# Patient Record
Sex: Female | Born: 1960 | Race: White | Hispanic: No | Marital: Married | Smoking: Former smoker
Health system: Southern US, Community
[De-identification: ages and names within clinical notes are randomized; demographics above are authoritative.]

## PROBLEM LIST (undated history)

## (undated) DIAGNOSIS — R001 Bradycardia, unspecified: Secondary | ICD-10-CM

## (undated) DIAGNOSIS — M1990 Unspecified osteoarthritis, unspecified site: Secondary | ICD-10-CM

## (undated) HISTORY — PX: INGUINAL HERNIA REPAIR: SHX194

## (undated) HISTORY — PX: HIP SURGERY: SHX245

## (undated) HISTORY — PX: HERNIA REPAIR: SHX51

## (undated) HISTORY — PX: TOTAL VAGINAL HYSTERECTOMY: SHX2548

## (undated) HISTORY — PX: ABDOMINAL HYSTERECTOMY: SHX81

## (undated) HISTORY — PX: GALLBLADDER SURGERY: SHX652

## (undated) HISTORY — DX: Unspecified osteoarthritis, unspecified site: M19.90

## (undated) HISTORY — PX: CHOLECYSTECTOMY: SHX55

---

## 2011-02-28 ENCOUNTER — Ambulatory Visit (INDEPENDENT_AMBULATORY_CARE_PROVIDER_SITE_OTHER): Payer: Federal, State, Local not specified - PPO | Admitting: Family Medicine

## 2011-02-28 ENCOUNTER — Encounter: Payer: Self-pay | Admitting: Family Medicine

## 2011-02-28 VITALS — BP 114/71 | HR 48 | Temp 97.0°F | Resp 16 | Ht 66.0 in | Wt 177.6 lb

## 2011-02-28 DIAGNOSIS — M25559 Pain in unspecified hip: Secondary | ICD-10-CM

## 2011-02-28 DIAGNOSIS — E669 Obesity, unspecified: Secondary | ICD-10-CM

## 2011-02-28 DIAGNOSIS — Z23 Encounter for immunization: Secondary | ICD-10-CM

## 2011-02-28 DIAGNOSIS — Z0000 Encounter for general adult medical examination without abnormal findings: Secondary | ICD-10-CM

## 2011-02-28 DIAGNOSIS — G8929 Other chronic pain: Secondary | ICD-10-CM

## 2011-02-28 DIAGNOSIS — Z720 Tobacco use: Secondary | ICD-10-CM

## 2011-02-28 LAB — CBC
MCH: 31.5 pg (ref 26.0–34.0)
MCV: 94.8 fL (ref 78.0–100.0)
Platelets: 190 10*3/uL (ref 150–400)
RDW: 13.5 % (ref 11.5–15.5)
WBC: 7 10*3/uL (ref 4.0–10.5)

## 2011-02-28 LAB — POCT URINALYSIS DIPSTICK
Nitrite, UA: NEGATIVE
Urobilinogen, UA: 0.2
pH, UA: 5.5

## 2011-02-28 LAB — COMPREHENSIVE METABOLIC PANEL
ALT: 10 U/L (ref 0–35)
AST: 16 U/L (ref 0–37)
Chloride: 107 mEq/L (ref 96–112)
Creat: 0.73 mg/dL (ref 0.50–1.10)
Total Bilirubin: 0.5 mg/dL (ref 0.3–1.2)

## 2011-02-28 LAB — POCT UA - MICROSCOPIC ONLY
Casts, Ur, LPF, POC: NEGATIVE
Mucus, UA: NEGATIVE
Yeast, UA: NEGATIVE

## 2011-02-28 LAB — LIPID PANEL
Total CHOL/HDL Ratio: 3.5 Ratio
VLDL: 20 mg/dL (ref 0–40)

## 2011-02-28 NOTE — Patient Instructions (Signed)
Smoking Cessation, Tips for Success YOU CAN QUIT SMOKING If you are ready to quit smoking, congratulations! You have chosen to help yourself be healthier. Cigarettes bring nicotine, tar, carbon monoxide, and other irritants into your body. Your lungs, heart, and blood vessels will be able to work better without these poisons. There are many different ways to quit smoking. Nicotine gum, nicotine patches, a nicotine inhaler, or nicotine nasal spray can help with physical craving. Hypnosis, support groups, and medicines help break the habit of smoking. Here are some tips to help you quit for good.  Throw away all cigarettes.     Clean and remove all ashtrays from your home, work, and car.     On a card, write down your reasons for quitting. Carry the card with you and read it when you get the urge to smoke.     Cleanse your body of nicotine. Drink enough water and fluids to keep your urine clear or pale yellow. Do this after quitting to flush the nicotine from your body.     Learn to predict your moods. Do not let a bad situation be your excuse to have a cigarette. Some situations in your life might tempt you into wanting a cigarette.     Never have "just one" cigarette. It leads to wanting another and another. Remind yourself of your decision to quit.     Change habits associated with smoking. If you smoked while driving or when feeling stressed, try other activities to replace smoking. Stand up when drinking your coffee. Brush your teeth after eating. Sit in a different chair when you read the paper. Avoid alcohol while trying to quit, and try to drink fewer caffeinated beverages. Alcohol and caffeine may urge you to smoke.     Avoid foods and drinks that can trigger a desire to smoke, such as sugary or spicy foods and alcohol.     Ask people who smoke not to smoke around you.     Have something planned to do right after eating or having a cup of coffee. Take a walk or exercise to perk you up.  This will help to keep you from overeating.     Try a relaxation exercise to calm you down and decrease your stress. Remember, you may be tense and nervous for the first 2 weeks after you quit, but this will pass.     Find new activities to keep your hands busy. Play with a pen, coin, or rubber band. Doodle or draw things on paper.     Brush your teeth right after eating. This will help cut down on the craving for the taste of tobacco after meals. You can try mouthwash, too.     Use oral substitutes, such as lemon drops, carrots, a cinnamon stick, or chewing gum, in place of cigarettes. Keep them handy so they are available when you have the urge to smoke.     When you have the urge to smoke, try deep breathing.     Designate your home as a nonsmoking area.     If you are a heavy smoker, ask your caregiver about a prescription for nicotine chewing gum. It can ease your withdrawal from nicotine.     Reward yourself. Set aside the cigarette money you save and buy yourself something nice.     Look for support from others. Join a support group or smoking cessation program. Ask someone at home or at work to help you with your plan  to quit smoking.     Always ask yourself, "Do I need this cigarette or is this just a reflex?" Tell yourself, "Today, I choose not to smoke," or "I do not want to smoke." You are reminding yourself of your decision to quit, even if you do smoke a cigarette.  HOW WILL I FEEL WHEN I QUIT SMOKING?  The benefits of not smoking start within days of quitting.     You may have symptoms of withdrawal because your body is used to nicotine (the addictive substance in cigarettes). You may crave cigarettes, be irritable, feel very hungry, cough often, get headaches, or have difficulty concentrating.     The withdrawal symptoms are only temporary. They are strongest when you first quit but will go away within 10 to 14 days.     When withdrawal symptoms occur, stay in control.  Think about your reasons for quitting. Remind yourself that these are signs that your body is healing and getting used to being without cigarettes.     Remember that withdrawal symptoms are easier to treat than the major diseases that smoking can cause.     Even after the withdrawal is over, expect periodic urges to smoke. However, these cravings are generally short-lived and will go away whether you smoke or not. Do not smoke!     If you relapse and smoke again, do not lose hope. Most smokers quit 3 times before they are successful.     If you relapse, do not give up! Plan ahead and think about what you will do the next time you get the urge to smoke.  LIFE AS A NONSMOKER: MAKE IT FOR A MONTH, MAKE IT FOR LIFE Day 1: Hang this page where you will see it every day. Day 2: Get rid of all ashtrays, matches, and lighters. Day 3: Drink water. Breathe deeply between sips. Day 4: Avoid places with smoke-filled air, such as bars, clubs, or the smoking section of restaurants. Day 5: Keep track of how much money you save by not smoking. Day 6: Avoid boredom. Keep a good book with you or go to the movies. Day 7: Reward yourself! One week without smoking! Day 8: Make a dental appointment to get your teeth cleaned. Day 9: Decide how you will turn down a cigarette before it is offered to you. Day 10: Review your reasons for quitting. Day 11: Distract yourself. Stay active to keep your mind off smoking and to relieve tension. Take a walk, exercise, read a book, do a crossword puzzle, or try a new hobby. Day 12: Exercise. Get off the bus before your stop or use stairs instead of escalators. Day 13: Call on friends for support and encouragement. Day 14: Reward yourself! Two weeks without smoking! Day 15: Practice deep breathing exercises. Day 16: Bet a friend that you can stay a nonsmoker. Day 17: Ask to sit in nonsmoking sections of restaurants. Day 18: Hang up "No Smoking" signs. Day 19: Think of  yourself as a nonsmoker. Day 20: Each morning, tell yourself you will not smoke. Day 21: Reward yourself! Three weeks without smoking! Day 22: Think of smoking in negative ways. Remember how it stains your teeth, gives you bad breath, and leaves you short of breath. Day 23: Eat a nutritious breakfast. Day 24:Do not relive your days as a smoker. Day 25: Hold a pencil in your hand when talking on the telephone. Day 26: Tell all your friends you do not smoke. Day 27: Think  about how much better food tastes. Day 28: Remember, one cigarette is one too many. Day 29: Take up a hobby that will keep your hands busy. Day 30: Congratulations! One month without smoking! Give yourself a big reward. Your caregiver can direct you to community resources or hospitals for support, which may include:  Group support.     Education.    Hypnosis.    Subliminal therapy.  Document Released: 10/12/2003 Document Revised: 09/25/2010 Document Reviewed: 10/30/2008 [facility] Patient Information 2012 [place], [place]   .Obesity Obesity is defined as having a body mass index (BMI) of 30 or more. To calculate your BMI divide your weight in pounds by your height in inches squared and multiply that product by 703. Major illnesses resulting from long-term obesity include:  Stroke.     Heart disease.     Diabetes.    Many cancers.     Arthritis.  Obesity also complicates recovery from many other medical problems.   CAUSES    A history of obesity in your parents.     Thyroid hormone imbalance.     Environmental factors such as excess calorie intake and physical inactivity.  TREATMENT   A healthy weight loss program includes:  A calorie restricted diet based on individual calorie needs.     Increased physical activity (exercise).  An exercise program is just as important as the right low-calorie diet.   Weight-loss medicines should be used only under the supervision of your physician. These medicines  help, but only if they are used with diet and exercise programs. Medicines can have side effects including nervousness, nausea, abdominal pain, diarrhea, headache, drowsiness, and depression.   An unhealthy weight loss program includes:  Fasting.     Fad diets.     Supplements and drugs.  These choices do not succeed in long-term weight control.   HOME CARE INSTRUCTIONS   To help you make the needed dietary changes:    Exercise and perform physical activity as directed by your caregiver.     Keep a daily record of everything you eat. There are many free websites to help you with this. It may be helpful to measure your foods so you can determine if you are eating the correct portion sizes.     Use low-calorie cookbooks or take special cooking classes.     Avoid alcohol. Drink more water and drinks with no calories.     Take vitamins and supplements only as recommended by your caregiver.     Weight loss support groups, Registered Dieticians, counselors, and stress reduction education can also be very helpful.  Document Released: 02/21/2004 Document Revised: 09/25/2010 Document Reviewed: 12/20/2006 [facility] Patient Information 2012 [place], [place].

## 2011-03-03 ENCOUNTER — Encounter: Payer: Self-pay | Admitting: Family Medicine

## 2011-03-03 NOTE — Progress Notes (Signed)
  Subjective:    Patient ID: Nicole Wilcox, female    DOB: September 06, 1960, 51 y.o.   MRN: [id number]  HPI    This 51 y.o. Cauc female presents for complete physical exam; her last PAP was 2011(she is s/p TAH-BSO for benign reasons). Last mmg was 2011 and was normal. These HCM exams were performed in [place]. She is retired due to chronic hip pain which is disabling.    Review of Systems  Constitutional: Negative.  Negative for fever, activity change and appetite change.  HENT: Negative.   Eyes: Positive for visual disturbance.       Wears "readers"  Respiratory: Negative.   Cardiovascular: Negative.   Gastrointestinal: Negative.   Genitourinary: Negative for dysuria, urgency, frequency, difficulty urinating and pelvic pain.  Musculoskeletal: Positive for arthralgias and gait problem.       Chronic hip pain limits mobility and interrupts sleep. Weakness in muscles and joints.  Skin: Negative.   Neurological: Negative.   Psychiatric/Behavioral: Negative.        Objective:   Physical Exam  Vitals reviewed. Constitutional: She is oriented to person, place, and time. She appears well-developed and well-nourished.  HENT:  Head: Normocephalic and atraumatic.  Right Ear: External ear normal.  Left Ear: External ear normal.  Nose: Nose normal.  Mouth/Throat: Oropharynx is clear and moist.  Eyes: Conjunctivae and EOM are normal. Pupils are equal, round, and reactive to light. No scleral icterus.  Neck: Normal range of motion. Neck supple. No JVD present. No thyromegaly present.  Cardiovascular: Normal rate, normal heart sounds and intact distal pulses.   No murmur heard. Pulmonary/Chest: Effort normal and breath sounds normal. No respiratory distress. She has no wheezes.  Abdominal: Soft. Bowel sounds are normal. She exhibits no mass. There is no tenderness. There is no guarding.  Genitourinary: Guaiac negative stool.       Normal external genitalia; no hemorrhoids, no internal rectal  masses.  Musculoskeletal: She exhibits tenderness. She exhibits no edema.       Decreased ROM of left hip; ext rotation limited to 30 degrees; SLR to 45 degrees passively (less with voluntary SLR). Weak hip flexor muscle.  Lymphadenopathy:    She has no cervical adenopathy.  Neurological: She is alert and oriented to person, place, and time. She has normal reflexes. No cranial nerve deficit. She exhibits normal muscle tone.  Skin: Skin is warm and dry.  Psychiatric: She has a normal mood and affect. Her behavior is normal. Thought content normal.          Assessment & Plan:   1. Routine general medical examination at a health care facility    2. Tobacco user  Pt given print info re: Smoking cessation and advised to contact [place] for assistance/support. Also may consider E-cigarettes.  3. Chronic left hip pain  Stable; encouraged regular exercise for maintenance of flexibility. OTC analgesic effective for now.  4. Obesity  Print info re: simple weight loss tips (i.e. Portion control and elimination of junk food and eating regular meals and healthy snacks.)  5. Health care maintenance  Mammogram to be scheduled.  6. Hematuria  POCT UA - Microscopic Only ;Urinalysis showed no significant RBCs (0-1)

## 2011-03-03 NOTE — Progress Notes (Signed)
Quick Note:  You labs are abnormal. Please call pt and notify her that her cholesterol is elevated ( 210). Her HDL is very good= 60 ; LDL = 130. Advise her to address dietary changes as outlined in the handout given to her at her visit. Consider rechecking these values in 3-6 months.  All other labs are normal; Vit D level is pending.  ______

## 2011-03-04 LAB — VITAMIN D 1,25 DIHYDROXY
Vitamin D 1, 25 (OH)2 Total: 35 pg/mL (ref 18–72)
Vitamin D3 1, 25 (OH)2: 35 pg/mL

## 2011-05-01 ENCOUNTER — Telehealth: Payer: Self-pay

## 2011-05-01 NOTE — Telephone Encounter (Signed)
Pt states that she would like to have something called in for her to stop smoking.  Pt states she would like for someone to give her a call when you send it in to the pharmacy or if she needs to come in and see you again.

## 2011-05-05 ENCOUNTER — Telehealth: Payer: Self-pay

## 2011-05-05 NOTE — Telephone Encounter (Signed)
Pt states she called Thursday to request rx to stop smoking no one has returned her called

## 2011-05-05 NOTE — Telephone Encounter (Signed)
Dr. McPherson - please review. 

## 2011-05-06 MED ORDER — VARENICLINE TARTRATE 0.5 MG PO TABS: ORAL_TABLET | ORAL | Status: DC

## 2011-05-06 MED ORDER — VARENICLINE TARTRATE 1 MG PO TABS: ORAL_TABLET | ORAL | Status: DC

## 2011-05-06 NOTE — Telephone Encounter (Signed)
I will e-prescribe Chantix and she will need to follow the instructions for best results. She will need to stop smoking within 10-14 days of starting the product. Contact the clinic if she has problems with Chantix.

## 2011-05-06 NOTE — Telephone Encounter (Signed)
Addended by: Dow Adolph B on: 05/06/2011 06:56 PM   Modules accepted: Orders

## 2011-05-06 NOTE — Telephone Encounter (Signed)
I have reviewed pt's chart and request for aid for smoking cessation. Chantix has been e-prescribed and pt is encouraged to make sure she understands use of this product. She can contact clinic with any questions.

## 2011-05-07 NOTE — Telephone Encounter (Signed)
Gave pt info about chantix Rx. Pt was worried about SEs of depression she has heard about, but reports that she does not have any Sxs of depression now. Pt agreed to try Chantix and CB if she has any SEs of depression or anything else.

## 2011-07-03 ENCOUNTER — Encounter: Payer: Self-pay | Admitting: Family Medicine

## 2011-07-03 ENCOUNTER — Ambulatory Visit (INDEPENDENT_AMBULATORY_CARE_PROVIDER_SITE_OTHER): Payer: Federal, State, Local not specified - PPO | Admitting: Family Medicine

## 2011-07-03 VITALS — BP 114/76 | HR 60 | Temp 98.1°F | Resp 18 | Ht 65.5 in | Wt 181.8 lb

## 2011-07-03 DIAGNOSIS — Z720 Tobacco use: Secondary | ICD-10-CM

## 2011-07-03 DIAGNOSIS — E8940 Asymptomatic postprocedural ovarian failure: Secondary | ICD-10-CM

## 2011-07-03 DIAGNOSIS — R6889 Other general symptoms and signs: Secondary | ICD-10-CM

## 2011-07-03 DIAGNOSIS — Z789 Other specified health status: Secondary | ICD-10-CM

## 2011-07-03 LAB — CBC WITH DIFFERENTIAL/PLATELET
Basophils Relative: 1 % (ref 0–1)
Eosinophils Absolute: 0.2 10*3/uL (ref 0.0–0.7)
Lymphs Abs: 1.9 10*3/uL (ref 0.7–4.0)
MCH: 30.9 pg (ref 26.0–34.0)
MCHC: 33.1 g/dL (ref 30.0–36.0)
Neutro Abs: 2.9 10*3/uL (ref 1.7–7.7)
Neutrophils Relative %: 54 % (ref 43–77)
Platelets: 198 10*3/uL (ref 150–400)
RBC: 4.37 MIL/uL (ref 3.87–5.11)

## 2011-07-03 LAB — TSH: TSH: 1.477 u[IU]/mL (ref 0.350–4.500)

## 2011-07-03 LAB — T4, FREE: Free T4: 1.08 ng/dL (ref 0.80–1.80)

## 2011-07-03 NOTE — Patient Instructions (Signed)
Menopause and Herbal Products Menopause is the normal time of life when menstrual periods stop completely. Menopause is complete when you have missed 12 consecutive menstrual periods. It usually occurs between the ages of 51 to 55, with an average age of 51. Very rarely does a woman develop menopause before 51 years old. At menopause, your ovaries stop producing the female hormones, estrogen and progesterone. This can cause undesirable symptoms and also affect your health. Sometimes the symptoms can occur 4 to 5 years before the menopause begins. There is no relationship between menopause and:  Oral contraceptives.   Number of children you had.   Race.   The age your menstrual periods started (menarche).  Heavy smokers and very thin women may develop menopause earlier in life. Estrogen and progesterone hormone treatment is the usual method of treating menopausal symptoms. However, there are women who should not take hormone treatment. This is true of:   Women that have breast or uterine cancer.   Women who prefer not to take hormones because of certain side effects (abnormal uterine bleeding).   Women who are afraid that hormones may cause breast cancer.   Women who have a history of liver disease, heart disease, stroke, or blood clots.  For these women, there are other medications that may help treat their menopausal symptoms. These medications are found in plants and botanical products. They can be found in the form of herbs, teas, oils, tinctures, and pills.  CAUSES:  The ovaries stop producing the female hormones estrogen and progesterone.   Other causes include:   Surgery to remove both ovaries.   The ovaries stop functioning for no know reason.   Tumors of the pituitary gland in the brain.   Medical disease that affects the ovaries and hormone production.   Radiation treatment to the abdomen or pelvis.   Chemotherapy that affects the ovaries.   PHYTOESTROGENS: Phytoestrogen's occur naturally in plants and plant products. They act like estrogen in the body. Herbal medications are made from these plants and botanical steroids. There are 3 types of phytoestrogens:  Isoflavones (genistein and daidzein) are found in soy, garbanzo beans, miso and tofu foods.   Ligins are found in the shell of seeds. They are used to make oils like flaxseed oil. The bacteria in your intestine act on these foods to produce the estrogen-like hormones.   Coumestans are estrogen-like. Some of the foods they are found in include sunflower seeds and bean sprouts.  CONDITIONS AND THERE POSSIBLE HERBAL TREATMENT:  Hot flashes and night sweats.   Soy, black cohosh and evening primrose.   Irritability, insomnia, depression and memory problems.   Chasteberry, ginseng, and soy.   St. John's wort may be helpful for depression. However, there is a concern of it causing cataracts of the eye and may have bad effects on other medications. St. John's wort should not be taken for long time and without your caregiver's advice.   Loss of libido and vaginal and skin dryness.   Wild yam and soy.   Prevention of coronary heart disease and osteoporosis.   Soy and Isoflavones.  Several studies have shown that some women benefit from herbal medications, but most of the studies have not consistently shown that these supplements are much better than placebo. Other forms of treatment to help women with menopausal symptoms include a balanced diet, rest, exercise, vitamin and calcium (with vitamin D) supplements, acupuncture, and group therapy when necessary. THOSE WHO SHOULD NOT TAKE HERBAL MEDICATIONS INCLUDE:    Women who are planning on getting pregnant unless told by your caregiver.   Women who are breastfeeding unless told by your caregiver.   Women who are taking other prescription medications unless told by your caregiver.   Infants, children, and elderly women unless  told by your caregiver.  Different herbal medications have different and unmeasured amounts of the herbal ingredients. There are no regulations, quality control, and standardization of the ingredients in herbal medications. Therefore, the amount of the ingredient in the medication may vary from one herb, pill, tea, oil or tincture to another. Many herbal medications can cause serious problems and can even have poisonous effects if taken too much or too long. If problems develop, the medication should be stopped and recorded by your caregiver. HOME CARE INSTRUCTIONS  Do not take or give children herbal medications without your caregiver's advice.   Let your caregiver know all the medications you are taking. This includes prescription, over-the-counter, eye drops, and creams.   Do not take herbal medications longer or more than recommended.   Tell your caregiver about any side effects from the medication.  SEEK MEDICAL CARE IF:  You develop a fever of 102 F (38.9 C), or as directed by your caregiver.   You feel sick to your stomach (nauseous), vomit, or have diarrhea.   You develop a rash.   You develop abdominal pain.   You develop severe headaches.   You start to have vision problems.   You feel dizzy or faint.   You start to feel numbness in any part of your body.   You start shaking (have convulsions).  Document Released: 07/02/2007 Document Revised: 01/02/2011 Document Reviewed: 01/29/2010 [facility] Patient Information 2012 [place], [place].   Get OTC Multivitamin for Women over age 48 [employer] Made for women is a good brand) and take 1 tablet daily with a large meal. Also start Vitamin D 1000 IU  Take 1 capsule daily and try to get sun exposure most days of the week.

## 2011-07-04 LAB — VITAMIN D 25 HYDROXY (VIT D DEFICIENCY, FRACTURES): Vit D, 25-Hydroxy: 27 ng/mL — ABNORMAL LOW (ref 30–89)

## 2011-07-06 ENCOUNTER — Encounter: Payer: Self-pay | Admitting: Family Medicine

## 2011-07-06 DIAGNOSIS — E8940 Asymptomatic postprocedural ovarian failure: Secondary | ICD-10-CM | POA: Insufficient documentation

## 2011-07-06 NOTE — Progress Notes (Signed)
  Subjective:    Patient ID: Nicole Wilcox, female    DOB: 24-Nov-1960, 51 y.o.   MRN: [id number]  HPI   This 51 y.o. Cauc female c/o cold intolerance, palpitations without any other cardiac symptoms  and anxiety for several weeks. She recently quit smoking using Chantix but had to discontinue the this  medication after 5 weeks because of side effects. She is s/p TAH-BSO years ago and ha never been  on HRT. She does have mild night sweats. She feels comfortable sitting on her porch or working in the  yard in warm weather and does not perspire.    Review of Systems  Constitutional: Negative for fever, chills, activity change, appetite change, fatigue and unexpected weight change.  HENT: Negative for sore throat, trouble swallowing and neck pain.   Eyes: Negative.   Respiratory: Negative for cough, chest tightness and shortness of breath.   Cardiovascular: Positive for palpitations. Negative for chest pain.  Gastrointestinal: Negative.   Skin: Negative.   Neurological: Negative.   Psychiatric/Behavioral: Negative.        Objective:   Physical Exam  Vitals reviewed. Constitutional: She is oriented to person, place, and time. She appears well-developed and well-nourished. No distress.  HENT:  Head: Normocephalic and atraumatic.  Right Ear: External ear normal.  Left Ear: External ear normal.  Mouth/Throat: Oropharynx is clear and moist.  Eyes: Conjunctivae and EOM are normal. Pupils are equal, round, and reactive to light. No scleral icterus.  Neck: Normal range of motion. Neck supple. No thyromegaly present.  Cardiovascular: Normal rate, regular rhythm and normal heart sounds.  Exam reveals no gallop and no friction rub.   No murmur heard. Pulmonary/Chest: Effort normal and breath sounds normal. She has no wheezes.  Abdominal: Soft. Bowel sounds are normal. There is no tenderness. There is no guarding.  Lymphadenopathy:    She has no cervical adenopathy.  Neurological: She is  alert and oriented to person, place, and time. She displays abnormal reflex. No cranial nerve deficit.       DTRs diminished (0-1+)  Skin: Skin is warm and dry. No rash noted. No erythema. No pallor.  Psychiatric: She has a normal mood and affect. Her behavior is normal.          Assessment & Plan:   1. Cold intolerance - R/O Thyroid disorder T4, Free, TSH, CBC with Differential, Vitamin D, 25-hydroxy  2. Surgical menopause    3. Medication intolerance  Early discontinuance of Chantix due to side effects; pt remains tobacco-free

## 2011-07-08 NOTE — Progress Notes (Signed)
Quick Note:  Please call pt and advise that the following labs are abnormal... Vitamin D is a little low. Get Vitamin D3 2000 IU and take 1 capsule daily . Try to get daily sun exposure for 10-15 minutes and eat more fish (salmon,tuna, sardines) . Also have more Vitamin D fortified foods.  All other labs are normal.  Copy to pt. ______

## 2011-07-09 ENCOUNTER — Telehealth: Payer: Self-pay

## 2011-07-09 NOTE — Telephone Encounter (Signed)
Patient wants to know her lab results

## 2011-07-10 NOTE — Telephone Encounter (Signed)
Explained lab results to pt. She wanted to know if low vit D could cause her to be cold all the time? Also she stated that at one time she was told that she had falcemia and wondered if that could be coming back. She thought Dr Audria Nine tested for that. Was that lab done and was it negative?

## 2011-07-11 NOTE — Telephone Encounter (Signed)
Getting Vit D up to ~ 50 should help her feel better. Patients present with a variety of symptoms when they are tested for Vit D Deficiency. She should feel better after taking the Vit D for several months.  Re: possible history of Thalassemia (if this is what she is referring to)- her CBC shows not abnormalities  at all which is not typical for this type of anemia. With some Thalassemias, individuals ar completely asymptomatic.  There is usually some abnormality of the CBC that would cause the lab to do a smear and other tests to explain the abnormal results. Again, pt's CBC was absolutely normal.

## 2011-07-12 NOTE — Telephone Encounter (Signed)
Advised pt of notes 

## 2011-09-26 ENCOUNTER — Ambulatory Visit (INDEPENDENT_AMBULATORY_CARE_PROVIDER_SITE_OTHER): Payer: Federal, State, Local not specified - PPO | Admitting: Family Medicine

## 2011-09-26 ENCOUNTER — Encounter: Payer: Self-pay | Admitting: Family Medicine

## 2011-09-26 VITALS — BP 124/80 | HR 51 | Temp 97.4°F | Resp 16 | Ht 65.0 in | Wt 190.6 lb

## 2011-09-26 DIAGNOSIS — F329 Major depressive disorder, single episode, unspecified: Secondary | ICD-10-CM

## 2011-09-26 DIAGNOSIS — F32A Depression, unspecified: Secondary | ICD-10-CM

## 2011-09-26 DIAGNOSIS — Z87891 Personal history of nicotine dependence: Secondary | ICD-10-CM

## 2011-09-26 MED ORDER — BUPROPION HCL ER (SR) 150 MG PO TB12: 150.0000 mg | ORAL_TABLET | Freq: Two times a day (BID) | ORAL | Status: DC

## 2011-09-26 NOTE — Patient Instructions (Signed)
Medication for Depression and nicotine addiction: For the first week, take Bupropion once a day in the morning. Starting the 2nd week, take Bupropion twice a day (dose it 8 hours apart). Get into an exercise program like water aerobics which will allow you to exercise without putting a lot of strain on your joints.

## 2011-09-26 NOTE — Progress Notes (Signed)
S: This 51 y.o. Cauc female has recently quit smoking by using Chantix. Now she is experiencing depression because of weight gain and she really misses cigarettes. Her husband smokes and she does not like the smell of tobacco yet she feels  the urge to resume cigarette use. She cannot exercise as much as she would like due to chronic hip pain; she is awaiting appt for MRI and possible surgery. She has tried walking with her granddaughter which is the one reason she gives for  smoking cessation.  ROS: Unintentional weight gain but no change of appetite; + chronic hip pain; + dysphoric mood and irritability. No cough, chest tightness, SOB, wheezing, GI upset.  O:  Filed Vitals:   09/26/11 0824  BP: 124/80  Pulse: 51  Temp: 97.4 F (36.3 C)  Resp: 16  Weight up  13 lbs since Feb 2013  GEN: In NAD; WN,WD. HENT: [place]/AT; EOMI, conj/scl clear. LUNGS: Normal resp rate and effort; no cough or wheezes COR: RRR NEURO: A&O x 3; CNs intact; otherwise nonfocal. Mentation: Affect is somewhat flat but mood is normal, pt is attentive and calm with good eye contact and engaged in conversation.   A/P: 1. Depression - RX: Buproprion SR 150 mg 1 tab every AM x 1 week then 1 tablet  Twice a day (not less than 8 hours apart). Try to join an aquatics exercise class and attend at least 2-3 days /week. Follow-up with ORTHO re: hip pain and treatment plan.  2. History of tobacco use - encouraged continued abstinence    Total face-to-face time with pt= 30 minutes.

## 2011-12-19 ENCOUNTER — Encounter: Payer: Self-pay | Admitting: Family Medicine

## 2011-12-19 ENCOUNTER — Ambulatory Visit (INDEPENDENT_AMBULATORY_CARE_PROVIDER_SITE_OTHER): Payer: Federal, State, Local not specified - PPO | Admitting: Family Medicine

## 2011-12-19 VITALS — BP 100/70 | HR 52 | Temp 98.7°F | Resp 16 | Ht 65.5 in | Wt 195.2 lb

## 2011-12-19 DIAGNOSIS — Z86010 Personal history of colonic polyps: Secondary | ICD-10-CM

## 2011-12-19 DIAGNOSIS — R1013 Epigastric pain: Secondary | ICD-10-CM

## 2011-12-19 DIAGNOSIS — G8929 Other chronic pain: Secondary | ICD-10-CM

## 2011-12-19 DIAGNOSIS — R110 Nausea: Secondary | ICD-10-CM

## 2011-12-19 DIAGNOSIS — M25559 Pain in unspecified hip: Secondary | ICD-10-CM

## 2011-12-19 MED ORDER — OMEPRAZOLE 20 MG PO CPDR: 20.0000 mg | DELAYED_RELEASE_CAPSULE | Freq: Every day | ORAL | Status: DC

## 2011-12-19 MED ORDER — BUPROPION HCL ER (SR) 150 MG PO TB12: 150.0000 mg | ORAL_TABLET | Freq: Two times a day (BID) | ORAL | Status: DC

## 2011-12-19 MED ORDER — DICLOFENAC SODIUM 75 MG PO TBEC: 75.0000 mg | DELAYED_RELEASE_TABLET | Freq: Two times a day (BID) | ORAL | Status: DC

## 2011-12-19 NOTE — Patient Instructions (Signed)
Omeprazole has been prescribed to help with nausea and abdominal discomfort. Take 1 tablet every AM 30 minutes before eating. Take Diclofenac twice daily with food or snack; this med is for hip pain. Do not take any OTC medications (like Motrin or Aleve) while taking this medication.

## 2011-12-21 ENCOUNTER — Encounter: Payer: Self-pay | Admitting: Family Medicine

## 2011-12-21 NOTE — Progress Notes (Signed)
S:  This pt returns to discuss medications and inability to reduce weight. She monitors her nutrition but is unable to exercise due to persistent left hip pain. Her local orthopedist recommends conservative treatment; pt was sent to a general surgeon  at a [facility]. The Gen Surgeon has referred pt to a colleague of his in the Orthopedics Dept. because he feels that her pain is due to hip/pelvic bone pathology. She reports inabiltiy to bear weight on left leg for any period of time. She has had leg give way when she steps awkwardly . Pain is not controlled with current medications; pt is taking Ibuprofen 600 mg several times daily, often on empty stomach. She now c/o nausea and epig discomfort. She reports some dark stools.  ROS: As per HPI. Denies vomiting, BRBPR, melena, easy bruising, dizziness, weakness or syncope.  O:  Filed Vitals:   12/19/11 0940  BP: 100/70  Pulse: 52  Temp: 98.7 F (37.1 C)  Resp: 16   GEN: In NAD; WN,WD. SKIN: No pallor. HENT: [place]/AT; EOMI w/ clear conj/scl. Oroph benign w/ moist mucosa. NECK: No LAN or TMG. COR: RRR. LINGS: CTA. ABD: Soft, nondistended,; mild epig tenderness. No Murphy's sign. No masses or HSM. BACK: No CVAT, NEURO: A&O x 3; CNs intact. Nonfocal. Mentation- flat affect.  A/P: 1. Nausea alone  Discontinue Ibuprofen or other OTC NSAIDs RX: Omeprazole 20 mg 1 tab every AM.  2. Dyspepsia  PPI as above.  3. Chronic left hip pain  RX: Diclofenac 75 mg 1 tab bid w/ food or snack.  4. History of colon polyps  Ambulatory referral to Gastroenterology   Pt to remain on Bupropion as she remains tobacco-free.

## 2012-05-28 ENCOUNTER — Encounter: Payer: Self-pay | Admitting: Family Medicine

## 2012-05-28 ENCOUNTER — Ambulatory Visit (INDEPENDENT_AMBULATORY_CARE_PROVIDER_SITE_OTHER): Payer: Federal, State, Local not specified - PPO | Admitting: Family Medicine

## 2012-05-28 VITALS — BP 138/70 | HR 48 | Temp 98.0°F | Resp 16 | Ht 65.0 in | Wt 196.4 lb

## 2012-05-28 DIAGNOSIS — R6889 Other general symptoms and signs: Secondary | ICD-10-CM

## 2012-05-28 DIAGNOSIS — F32A Depression, unspecified: Secondary | ICD-10-CM

## 2012-05-28 DIAGNOSIS — R001 Bradycardia, unspecified: Secondary | ICD-10-CM

## 2012-05-28 DIAGNOSIS — F329 Major depressive disorder, single episode, unspecified: Secondary | ICD-10-CM

## 2012-05-28 DIAGNOSIS — I498 Other specified cardiac arrhythmias: Secondary | ICD-10-CM

## 2012-05-28 NOTE — Progress Notes (Signed)
S:  This 52 y.o. Cauc female is here for evaluation of low heart rate noted during colonoscopy earlier this week. Pt c/o cold intolerance, mild fatigue (though she attends aquatic aerobics), low libido and depressive symptoms. She reports intermittent palpitations after 30 minutes of exercise. Pt has been smoke- free for 1 year. She is most frustrated by weight gain and is feeling post-menopausal. She is trying a special weight loss program but has lost "only 5 pounds". She is concerned that her mother's 1st heart attack occurred at age 85 (pt's current age). She stopped taking Bupropion (ineffective); she has some situational stressors right now but feels like she is stable off medication. She sleeps well and has no thoughts of self-harm.   Patient Active Problem List   Diagnosis Date Noted  . 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 Tobacco user- pt quit early 2013 02/28/2011  . Chronic left hip pain 02/28/2011  . Obesity 02/28/2011  . Health care maintenance 02/28/2011    PMHx, Soc Hx and Fam Hx reviewed.  ROS: As per HPI. Negative for diaphoresis, activity change, difficulty swallowing, CP or tightness, SOB or DOE, cough, n/v or GI distress, HA or dizziness, weakness, numbness or syncope.   O:  Filed Vitals:   05/28/12 1631  BP: 138/70                         Weight is up 3 pounds since Nov 2103  Pulse: 48  Temp: 98 F (36.7 C)  Resp: 16   GEN: In NAD; WN,WD. HENT: [place]/AT; EOMI w/ clear conj/ sclerae. Oroph clear and moist. Otherwise unremarkable. COR: Sinus bradycardia. LUNGS: Normal resp rate and effort. SKIN: W&D; no rashes or pallor. NEURO: A&O x 3; CNs intact. Mentation- mildly sluggish w/ flat affect; calm demeanor, normal speech pattern, thought content and judgement.  ECG: Sinus bradycardia; no ST-TW changes and no ectopy.   A/P: Bradycardia - this is chronic and pt is symptomatic. ECG has no evidence of ischemia.  Plan: EKG 12-Lead, Basic metabolic panel, Ambulatory referral  to Cardiology  Cold intolerance - Plan: TSH, T3, Free, T4, Free, CBC with Differential  Chronic depression - pt feels like this is a situation depression; she is most bothered about her weight. She is not interested in anti-depressant medication at this time.   Plan: TSH  Pt advised to seek care in ED if she experiences CP or tightness, uncomfortable sustained palpitations, diaphoresis, dizziness, weakness or near-syncope.

## 2012-05-29 LAB — CBC WITH DIFFERENTIAL/PLATELET
Eosinophils Relative: 3 % (ref 0–5)
HCT: 39.5 % (ref 36.0–46.0)
Lymphocytes Relative: 37 % (ref 12–46)
Lymphs Abs: 2 10*3/uL (ref 0.7–4.0)
MCV: 88 fL (ref 78.0–100.0)
Monocytes Absolute: 0.3 10*3/uL (ref 0.1–1.0)
Monocytes Relative: 6 % (ref 3–12)
RBC: 4.49 MIL/uL (ref 3.87–5.11)
WBC: 5.5 10*3/uL (ref 4.0–10.5)

## 2012-05-29 LAB — BASIC METABOLIC PANEL
Chloride: 103 mEq/L (ref 96–112)
Potassium: 3.9 mEq/L (ref 3.5–5.3)
Sodium: 140 mEq/L (ref 135–145)

## 2012-05-29 LAB — T3, FREE: T3, Free: 2.6 pg/mL (ref 2.3–4.2)

## 2012-05-29 LAB — TSH: TSH: 2.008 u[IU]/mL (ref 0.350–4.500)

## 2012-05-31 ENCOUNTER — Telehealth: Payer: Self-pay

## 2012-05-31 NOTE — Telephone Encounter (Signed)
DR.MCPHERSON, PT STATES THAT SHE IS REALLY ACHEY HEART STILL IN THE 40'S, PT WOULD LIKE TO KNOW WHAT IS GOING ON WITH HER. BEST# [phone]

## 2012-05-31 NOTE — Progress Notes (Signed)
Quick Note:  Please notify pt that results are normal.   Provide pt with copy of labs. ______ 

## 2012-06-01 NOTE — Telephone Encounter (Signed)
I called this pt to discuss recent CARD eval and TFTs (normal). Pt still thinks she has a thyroid disorder (+ family hx). She is scheduled for Holter and stress test then f/u with Dr. Jacinto Halim. She admits excessive caffeine intake throughout the day; advised that she cut back over next 2 weeks to no more than 2 cups of coffee daily. If no cardiac etiology found, will refer pt to Endocrine specialist. She agrees w/ this plan.

## 2012-06-23 ENCOUNTER — Encounter: Payer: Self-pay | Admitting: Family Medicine

## 2012-06-24 ENCOUNTER — Encounter: Payer: Self-pay | Admitting: Family Medicine

## 2012-10-05 ENCOUNTER — Ambulatory Visit (INDEPENDENT_AMBULATORY_CARE_PROVIDER_SITE_OTHER): Payer: Federal, State, Local not specified - PPO | Admitting: Family Medicine

## 2012-10-05 ENCOUNTER — Encounter: Payer: Self-pay | Admitting: Family Medicine

## 2012-10-05 VITALS — BP 120/78 | HR 47 | Temp 98.1°F | Resp 16 | Ht 65.0 in | Wt 204.0 lb

## 2012-10-05 DIAGNOSIS — E669 Obesity, unspecified: Secondary | ICD-10-CM

## 2012-10-05 DIAGNOSIS — Z1159 Encounter for screening for other viral diseases: Secondary | ICD-10-CM

## 2012-10-05 DIAGNOSIS — Z0000 Encounter for general adult medical examination without abnormal findings: Secondary | ICD-10-CM

## 2012-10-05 LAB — POCT URINALYSIS DIPSTICK
Bilirubin, UA: NEGATIVE
Glucose, UA: NEGATIVE
Leukocytes, UA: NEGATIVE
Nitrite, UA: NEGATIVE
Urobilinogen, UA: 0.2
pH, UA: 7

## 2012-10-05 LAB — LIPID PANEL
Cholesterol: 208 mg/dL — ABNORMAL HIGH (ref 0–200)
Triglycerides: 84 mg/dL (ref <150)

## 2012-10-05 LAB — COMPREHENSIVE METABOLIC PANEL
ALT: 17 U/L (ref 0–35)
BUN: 17 mg/dL (ref 6–23)
CO2: 29 mEq/L (ref 19–32)
Calcium: 9.7 mg/dL (ref 8.4–10.5)
Chloride: 105 mEq/L (ref 96–112)
Creat: 0.83 mg/dL (ref 0.50–1.10)
Glucose, Bld: 85 mg/dL (ref 70–99)

## 2012-10-05 LAB — HEPATITIS C ANTIBODY: HCV Ab: NEGATIVE

## 2012-10-05 NOTE — Patient Instructions (Addendum)
Keeping You Healthy  Get These Tests  Blood Pressure- Have your blood pressure checked by your healthcare provider at least once a year.  Normal blood pressure is 120/80.  Weight- Have your body mass index (BMI) calculated to screen for obesity.  BMI is a measure of body fat based on height and weight.  You can calculate your own BMI at https://www.west-esparza.com/  Cholesterol- Have your cholesterol checked every year.  Diabetes- Have your blood sugar checked every year if you have high blood pressure, high cholesterol, a family history of diabetes or if you are overweight.  Pap Smear- Have a pap smear every 1 to 3 years if you have been sexually active.  If you are older than 65 and recent pap smears have been normal you may not need additional pap smears.  In addition, if you have had a hysterectomy  For benign disease additional pap smears are not necessary.  Mammogram-Yearly mammograms are essential for early detection of breast cancer  Screening for Colon Cancer- Colonoscopy starting at age 40. Screening may begin sooner depending on your family history and other health conditions.  Follow up colonoscopy as directed by your Gastroenterologist.  Screening for Osteoporosis- Screening begins at age 2 with bone density scanning, sooner if you are at higher risk for developing Osteoporosis.  Get these medicines  Calcium with Vitamin D- Your body requires 1200-1500 mg of Calcium a day and [phone] IU of Vitamin D a day.  You can only absorb 500 mg of Calcium at a time therefore Calcium must be taken in 2 or 3 separate doses throughout the day.  Hormones- Hormone therapy has been associated with increased risk for certain cancers and heart disease.  Talk to your healthcare provider about if you need relief from menopausal symptoms.  Aspirin- Ask your healthcare provider about taking Aspirin to prevent Heart Disease and Stroke.  Get these Immuniztions  Flu shot- Every fall  Pneumonia  shot- Once after the age of 52; if you are younger ask your healthcare provider if you need a pneumonia shot.  Tetanus- Every ten years. This is next due in 2023.  Zostavax- Once after the age of 65 to prevent shingles.  Take these steps  Don't smoke- Your healthcare provider can help you quit. For tips on how to quit, ask your healthcare provider or go to www.smokefree.gov or call 1-800 QUIT-NOW.  Be physically active- Exercise 5 days a week for a minimum of 30 minutes.  If you are not already physically active, start slow and gradually work up to 30 minutes of moderate physical activity.  Try walking, dancing, bike riding, swimming, etc. You are doing a great job at staying physically active; don't give up even if you have not seen the results that you desire.  Eat a healthy diet- Eat a variety of healthy foods such as fruits, vegetables, whole grains, low fat milk, low fat cheeses, yogurt, lean meats, chicken, fish, eggs, dried beans, tofu, etc.  For more information go to www.thenutritionsource.org  Dental visit- Brush and floss teeth twice daily; visit your dentist twice a year.  Eye exam- Visit your Optometrist or Ophthalmologist yearly.  Drink alcohol in moderation- Limit alcohol intake to one drink or less a day.  Never drink and drive.  Depression- Your emotional health is as important as your physical health.  If you're feeling down or losing interest in things you normally enjoy, please talk to your healthcare provider.  Seat Belts- can save your life; always wear  one  Smoke/Carbon Clinical cytogeneticist- These detectors need to be installed on the appropriate level of your home.  Replace batteries at least once a year.  Violence- If anyone is threatening or hurting you, please tell your healthcare provider.  Living Will/ Health care power of attorney- Discuss with your healthcare provider and family.   A referral for Nutrition Counseling has been placed. I will see you again in  6 weeks to discuss anxiety/ depression/ stress and how this could be affecting your weight loss efforts. DO NOT GIVE UP !!!

## 2012-10-05 NOTE — Progress Notes (Signed)
Subjective:    Patient ID: Nicole Wilcox, female    DOB: Jul 11, 1960, 52 y.o.   MRN: [id number]  HPI  This 52 y.o. Cauc female is here for CPE ,s/p BSO-TAH for benign reasons. Chronic L hip  pain to be evaluated by ORTHO in ~ 3 weeks in [place], [place]. Pt is exercising (swimming and  other aerobics) 4-5 days /week in addition to nutrition changes. She is very frustrated that weight  continues to increase despite these lifestyle changes, instituted 2-3 months ago. Weight gain  onset is linked to smoking cessation in April 2013.   Bradycardia was eval by Dr. Jacinto Halim and no further visits are needed. Pt had no ischemia on ETT;   she has average exercise tolerance. Dr. Jacinto Halim recommended activity as tolerated. She also had   nocturnal oxygen saturation test which indicated low probability of sleep apnea.   Pt's husband has PTSD and she has significant stress related to his care and their relationship.   HCM: MMG- Current (negative).            IMM- Current.            PAP- NA.            Vision- Advised evaluation.  Patient Active Problem List   Diagnosis Date Noted  . 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 Tobacco user 02/28/2011  . Chronic left hip pain 02/28/2011  . Obesity 02/28/2011  . Health care maintenance 02/28/2011    PMHx, Soc Hx and Fam Hx reviewed.  Medications reconciled.   Review of Systems  Constitutional: Positive for fatigue and unexpected weight change.  HENT: Negative.   Eyes: Negative.   Respiratory: Negative.   Cardiovascular: Negative.   Gastrointestinal: Negative.   Endocrine: Negative.   Genitourinary: Negative.   Musculoskeletal: Positive for arthralgias.  Skin: Negative.   Allergic/Immunologic: Negative.   Neurological: Negative.   Hematological: Negative.   Psychiatric/Behavioral: The patient is nervous/anxious.        Objective:   Physical Exam  Nursing note and vitals reviewed. Constitutional: She is oriented to person, place, and time. She appears  well-developed and well-nourished. No distress.  HENT:  Head: Normocephalic and atraumatic.  Right Ear: Hearing, external ear and ear canal normal. Tympanic membrane is scarred.  Left Ear: Hearing, external ear and ear canal normal. Tympanic membrane is scarred.  Nose: Nose normal. No mucosal edema, rhinorrhea, nasal deformity or septal deviation.  Mouth/Throat: Uvula is midline, oropharynx is clear and moist and mucous membranes are normal. No oral lesions. Normal dentition.  Eyes: Conjunctivae, EOM and lids are normal. Pupils are equal, round, and reactive to light. No scleral icterus.  Fundoscopic exam:      The right eye shows no arteriolar narrowing and no papilledema. The right eye shows red reflex.       The left eye shows no arteriolar narrowing and no papilledema. The left eye shows red reflex.  Neck: Trachea normal, normal range of motion, full passive range of motion without pain and phonation normal. Neck supple. No JVD present. No spinous process tenderness and no muscular tenderness present. Carotid bruit is not present. No erythema and normal range of motion present. No thyromegaly present.  Cardiovascular: Regular rhythm, S1 normal, S2 normal, normal heart sounds and normal pulses.   No extrasystoles are present. Bradycardia present.  PMI is not displaced.  Exam reveals no gallop and no friction rub.   No murmur heard. Pulmonary/Chest: Effort normal and breath sounds normal. No respiratory distress. Right  breast exhibits no inverted nipple, no mass, no nipple discharge, no skin change and no tenderness. Left breast exhibits no inverted nipple, no mass, no nipple discharge, no skin change and no tenderness. Breasts are symmetrical.  Abdominal: Soft. Normal appearance. She exhibits no distension, no abdominal bruit, no pulsatile midline mass and no mass. Bowel sounds are decreased. There is no hepatosplenomegaly. There is tenderness in the left lower quadrant. There is no rigidity, no  guarding and no CVA tenderness. No hernia.  Genitourinary:  NEFG; Pelvic/PAP deferred. DRE not performed.  Musculoskeletal: She exhibits no edema and no tenderness.       Left hip: She exhibits decreased range of motion. She exhibits no bony tenderness and no deformity.  Lymphadenopathy:       Head (right side): No submental, no submandibular, no tonsillar, no posterior auricular and no occipital adenopathy present.       Head (left side): No submental, no submandibular, no tonsillar, no posterior auricular and no occipital adenopathy present.    She has no cervical adenopathy.    She has no axillary adenopathy.       Right: No inguinal and no supraclavicular adenopathy present.       Left: No inguinal and no supraclavicular adenopathy present.  Neurological: She is alert and oriented to person, place, and time. She has normal strength and normal reflexes. She is not disoriented. She displays no atrophy. No cranial nerve deficit or sensory deficit. She exhibits normal muscle tone. Coordination and gait normal.  Skin: Skin is warm, dry and intact. No ecchymosis and no rash noted. She is not diaphoretic. No cyanosis or erythema. No pallor. Nails show no clubbing.  Psychiatric: Her speech is normal and behavior is normal. Judgment and thought content normal. Cognition and memory are normal. She exhibits a depressed mood.    A1c=4.8%      Assessment & Plan:  Routine general medical examination at a health care facility - Encouraged pt to continue healthy lifestyle practices that she has adopted; benefits for cardiovascular health and stress reduction emphasized.  Plan: POCT urinalysis dipstick, Lipid panel, Comprehensive metabolic panel, Hepatitis C antibody, POCT glycosylated hemoglobin (Hb A1C)  Obesity - Plan: Amb ref to Medical Nutrition Therapy-MNT, Lipid panel  Need for hepatitis C screening test - Plan: Hepatitis C antibody

## 2012-10-06 NOTE — Progress Notes (Signed)
Quick Note:  Please notify pt that results are normal.   Provide pt with copy of labs. ______ 

## 2012-11-16 ENCOUNTER — Ambulatory Visit: Payer: Federal, State, Local not specified - PPO | Admitting: Family Medicine

## 2013-03-11 ENCOUNTER — Encounter: Payer: Self-pay | Admitting: Family Medicine

## 2013-03-11 ENCOUNTER — Ambulatory Visit (INDEPENDENT_AMBULATORY_CARE_PROVIDER_SITE_OTHER): Payer: Federal, State, Local not specified - PPO | Admitting: Family Medicine

## 2013-03-11 VITALS — BP 117/70 | HR 63 | Temp 97.9°F | Resp 16 | Ht 65.0 in | Wt 208.0 lb

## 2013-03-11 DIAGNOSIS — Z7901 Long term (current) use of anticoagulants: Secondary | ICD-10-CM

## 2013-03-11 LAB — PROTIME-INR
INR: 1.05 (ref <1.50)
Prothrombin Time: 13.6 seconds (ref 11.6–15.2)

## 2013-03-13 ENCOUNTER — Telehealth: Payer: Self-pay | Admitting: Family Medicine

## 2013-03-13 NOTE — Progress Notes (Signed)
S:  This 53 y.o. Cauc female is here s/p left THR in January 2015. Is doing very well and is pain=free compared to chronic pain she had for years. She is taking Coumadin and is due to stop this medication on Monday, Mar 14, 2013. Her ORTHO surgeon asked that she have one last PT/INR checked for safety. Her dose has been consistent and she reports no bleeding or other adverse effects of medication. She is ambulating w/ a cane; gets aroudn at home w/o assistance. She will begin PT next week.  Patient Active Problem List   Diagnosis Date Noted  . 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 Tobacco user 02/28/2011  . Chronic left hip pain 02/28/2011  . Obesity 02/28/2011  . Health care maintenance 02/28/2011   Prior to Admission medications   Medication Sig Start Date End Date Taking? Authorizing Provider  HYDROcodone-acetaminophen (NORCO/VICODIN) 5-325 MG per tablet Take 1 tablet by mouth every 6 (six) hours as needed for moderate pain.   Yes Historical Provider, MD  Multiple Vitamin (MULTIVITAMIN) tablet Take 1 tablet by mouth daily.   Yes Historical Provider, MD  warfarin (COUMADIN) 2.5 MG tablet Take 2.5 mg by mouth daily.   Yes Historical Provider, MD  cholecalciferol (VITAMIN D) 400 UNITS TABS Take by mouth daily.    Historical Provider, MD  Chromium Picolinate 500 MCG TABS Take by mouth daily.    Historical Provider, MD  omeprazole (PRILOSEC) 20 MG capsule Take 1 capsule (20 mg total) by mouth daily. 12/19/11   Maurice MarchBarbara B Prisca Gearing, MD   PMHx, Surg Hx, Soc and Fam Hx reviewed.  ROS: As per HPI. Noncontributory.  O: Filed Vitals:   03/11/13 1610  BP: 117/70  Pulse: 63  Temp: 97.9 F (36.6 C)  Resp: 16   GEN: In NAD; WN,WD. HENT: [place]/AT; EOMI w/ clear conj/sclerae. Otherwise unremarkable. COR: RRR. LUNGS: Unlabored resp. SKIN: W&D; Intact w/o diaphoresis, erythema, jaundice or pallor. NEURO: A&O x 3; CNs intact. Nonfocal. Ambulates w/ a cane.  A/P: Warfarin anticoagulation - Plan:  Protime-INR Will contact pt w/ result; discontinue this medication as directed on Monday, 03/14/13. Follow-up w/ ORTHO as scheduled.

## 2013-03-13 NOTE — Telephone Encounter (Signed)
I phoned pt to advise her of PT/INR result. She is doing well and [facility]

## 2013-03-15 ENCOUNTER — Ambulatory Visit: Admitting: Physical Therapy

## 2013-03-17 ENCOUNTER — Ambulatory Visit: Attending: Orthopaedic Surgery

## 2013-03-17 DIAGNOSIS — M25659 Stiffness of unspecified hip, not elsewhere classified: Secondary | ICD-10-CM | POA: Diagnosis not present

## 2013-03-17 DIAGNOSIS — IMO0001 Reserved for inherently not codable concepts without codable children: Secondary | ICD-10-CM | POA: Diagnosis present

## 2013-03-17 DIAGNOSIS — R262 Difficulty in walking, not elsewhere classified: Secondary | ICD-10-CM | POA: Diagnosis not present

## 2013-03-17 DIAGNOSIS — M25559 Pain in unspecified hip: Secondary | ICD-10-CM | POA: Diagnosis not present

## 2013-03-21 ENCOUNTER — Ambulatory Visit

## 2013-03-22 ENCOUNTER — Ambulatory Visit: Admitting: Rehabilitation

## 2013-03-23 ENCOUNTER — Ambulatory Visit: Admitting: Physical Therapy

## 2013-03-23 DIAGNOSIS — IMO0001 Reserved for inherently not codable concepts without codable children: Secondary | ICD-10-CM | POA: Diagnosis not present

## 2013-03-28 ENCOUNTER — Ambulatory Visit: Attending: Orthopaedic Surgery

## 2013-03-28 DIAGNOSIS — M25659 Stiffness of unspecified hip, not elsewhere classified: Secondary | ICD-10-CM | POA: Diagnosis not present

## 2013-03-28 DIAGNOSIS — M25559 Pain in unspecified hip: Secondary | ICD-10-CM | POA: Insufficient documentation

## 2013-03-28 DIAGNOSIS — IMO0001 Reserved for inherently not codable concepts without codable children: Secondary | ICD-10-CM | POA: Diagnosis not present

## 2013-03-28 DIAGNOSIS — R262 Difficulty in walking, not elsewhere classified: Secondary | ICD-10-CM | POA: Diagnosis not present

## 2013-03-30 ENCOUNTER — Encounter: Admitting: Physical Therapy

## 2013-04-04 ENCOUNTER — Encounter: Admitting: Rehabilitation

## 2013-04-06 ENCOUNTER — Encounter: Admitting: Rehabilitation

## 2013-10-07 ENCOUNTER — Encounter: Payer: Federal, State, Local not specified - PPO | Admitting: Family Medicine

## 2013-10-07 ENCOUNTER — Ambulatory Visit (INDEPENDENT_AMBULATORY_CARE_PROVIDER_SITE_OTHER): Payer: Federal, State, Local not specified - PPO | Admitting: Family Medicine

## 2013-10-07 ENCOUNTER — Encounter: Payer: Self-pay | Admitting: Family Medicine

## 2013-10-07 VITALS — BP 136/82 | HR 60 | Temp 97.8°F | Resp 16 | Ht 66.25 in | Wt 198.0 lb

## 2013-10-07 DIAGNOSIS — E669 Obesity, unspecified: Secondary | ICD-10-CM

## 2013-10-07 DIAGNOSIS — N9489 Other specified conditions associated with female genital organs and menstrual cycle: Secondary | ICD-10-CM

## 2013-10-07 DIAGNOSIS — Z0000 Encounter for general adult medical examination without abnormal findings: Secondary | ICD-10-CM

## 2013-10-07 NOTE — Patient Instructions (Addendum)
Keeping You Healthy  Get These Tests  Blood Pressure- Have your blood pressure checked by your healthcare provider at least once a year.  Normal blood pressure is 120/80.  Weight- Have your body mass index (BMI) calculated to screen for obesity.  BMI is a measure of body fat based on height and weight.  You can calculate your own BMI at www.nhlbisupport.com/bmi/  Cholesterol- Have your cholesterol checked every year.  Diabetes- Have your blood sugar checked every year if you have high blood pressure, high cholesterol, a family history of diabetes or if you are overweight.  Pap Smear- Have a pap smear every 1 to 3 years if you have been sexually active.  If you are older than 65 and recent pap smears have been normal you may not need additional pap smears.  In addition, if you have had a hysterectomy  For benign disease additional pap smears are not necessary.  Mammogram-Yearly mammograms are essential for early detection of breast cancer  Screening for Colon Cancer- Colonoscopy starting at age 50. Screening may begin sooner depending on your family history and other health conditions.  Follow up colonoscopy as directed by your Gastroenterologist.  Screening for Osteoporosis- Screening begins at age 65 with bone density scanning, sooner if you are at higher risk for developing Osteoporosis.  Get these medicines  Calcium with Vitamin D- Your body requires 1200-1500 mg of Calcium a day and 800-1000 IU of Vitamin D a day.  You can only absorb 500 mg of Calcium at a time therefore Calcium must be taken in 2 or 3 separate doses throughout the day.  Hormones- Hormone therapy has been associated with increased risk for certain cancers and heart disease.  Talk to your healthcare provider about if you need relief from menopausal symptoms.  Aspirin- Ask your healthcare provider about taking Aspirin to prevent Heart Disease and Stroke.  Get these Immuniztions  Flu shot- Every fall  Pneumonia  shot- Once after the age of 53; if you are younger ask your healthcare provider if you need a pneumonia shot.  Tetanus- Every ten years.  Zostavax- Once after the age of 53 to prevent shingles.  Take these steps  Don't smoke- Your healthcare provider can help you quit. For tips on how to quit, ask your healthcare provider or go to www.smokefree.gov or call 1-800 QUIT-NOW.  Be physically active- Exercise 5 days a week for a minimum of 30 minutes.  If you are not already physically active, start slow and gradually work up to 30 minutes of moderate physical activity.  Try walking, dancing, bike riding, swimming, etc.  Eat a healthy diet- Eat a variety of healthy foods such as fruits, vegetables, whole grains, low fat milk, low fat cheeses, yogurt, lean meats, chicken, fish, eggs, dried beans, tofu, etc.  For more information go to www.thenutritionsource.org  Dental visit- Brush and floss teeth twice daily; visit your dentist twice a year.  Eye exam- Visit your Optometrist or Ophthalmologist yearly.  Drink alcohol in moderation- Limit alcohol intake to one drink or less a day.  Never drink and drive.  Depression- Your emotional health is as important as your physical health.  If you're feeling down or losing interest in things you normally enjoy, please talk to your healthcare provider.  Seat Belts- can save your life; always wear one  Smoke/Carbon Monoxide detectors- These detectors need to be installed on the appropriate level of your home.  Replace batteries at least once a year.  Violence- If anyone   is threatening or hurting you, please tell your healthcare provider.  Living Will/ Health care power of attorney- Discuss with your healthcare provider and family.    Here are some suggestions for non-medicinal therapies for chronic pain: - Tumeric 500 mg capsule 1 daily - Magnesium + Zinc + Calcium 1 tablet daily or  plain Magnesium gluconate 300 mg /day - Metanx (B vitamin complex); I  can prescribe this and the dose is 1 tablet twice a day (this is not covered by your insurance). You can purchase a B vitamin complex that may be helpful.   If these therapies do not work, Gabapentin can be prescribed. Other treatments may be needed but would have to be prescribed or performed by a specialist.  I can see you again in 6 months to see if your symptoms have improved.  Contact the clinic if you do not see any improvement in 3-4 months and would like to discuss other treatments.

## 2013-10-07 NOTE — Progress Notes (Signed)
Subjective:    Patient ID: Nicole Wilcox, female    DOB: Sep 19, 1960, 53 y.o.   MRN: [id number]  HPI  This 53 y.o. Cauc female is here for CPE. She underwent L hip surgery earlier this year and has completed rehab. She is having hip and pelvic discomfort and attributes this to recurrent scar tissue. Pt has had > 2 previous hip surgeries.  She has ongoing complaints about difficulty w/ weight loss. She is not exercising regularly but intends to resume swimming. Calorie consumption is estimated at 1200-1500+ but pt not sure.  HCM: MMG- Current; due 2 years.           PAP- NA           CRS- April 2014 (normal).           IMM- Current; may be candidate for Prevnar13.  Patient Active Problem List   Diagnosis Date Noted  . 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 Tobacco user 02/28/2011  . Chronic left hip pain 02/28/2011  . Obesity 02/28/2011  . Health care maintenance 02/28/2011    Prior to Admission medications   Medication Sig Start Date End Date Taking? Authorizing Provider  Chromium Picolinate 500 MCG TABS Take by mouth daily.   Yes Historical Provider, MD  Multiple Vitamin (MULTIVITAMIN) tablet Take 1 tablet by mouth daily.   Yes Historical Provider, MD  cholecalciferol (VITAMIN D) 400 UNITS TABS Take by mouth daily.    Historical Provider, MD  omeprazole (PRILOSEC) 20 MG capsule Take 1 capsule (20 mg total) by mouth daily. 12/19/11   Maurice March, MD    History   Social History  . Marital Status: Married    Spouse Name: N/A    Number of Children: N/A  . Years of Education: N/A   Occupational History  . retired [employer]   Social History Main Topics  . Smoking status: Former Smoker -- 1.00 packs/day    Types: Cigarettes  . Smokeless tobacco: Not on file     Comment: quit 5 months ago  . Alcohol Use: No  . Drug Use: No  . Sexual Activity: Yes    Partners: Male   Other Topics Concern  . Not on file   Social History Narrative   Exercises alittle.    Family  History  Problem Relation Age of Onset  . Heart failure Mother   . Asthma Mother   . Diabetes Brother   . Cancer Father   . Diabetes Maternal Grandmother   . Hypertension Maternal Grandmother   . Stroke Maternal Grandmother   . Heart disease Maternal Grandfather   . Cancer Paternal Grandfather     Review of Systems  Constitutional: Positive for chills.  HENT: Negative.   Eyes: Negative.   Respiratory: Negative.   Cardiovascular: Positive for leg swelling.  Gastrointestinal: Negative.   Endocrine: Positive for cold intolerance.  Genitourinary: Negative.   Musculoskeletal: Positive for arthralgias, back pain, joint swelling and myalgias.  Allergic/Immunologic: Negative.   Neurological: Negative.   Hematological: Negative.   Psychiatric/Behavioral: Positive for dysphoric mood and decreased concentration.      Objective:   Physical Exam  Nursing note and vitals reviewed. Constitutional: She is oriented to person, place, and time. Vital signs are normal. She appears well-developed and well-nourished. No distress.  HENT:  Head: Normocephalic and atraumatic.  Right Ear: Hearing, tympanic membrane, external ear and ear canal normal.  Left Ear: Hearing, tympanic membrane, external ear and ear canal normal.  Nose: Nose  normal. No nasal deformity or septal deviation.  Mouth/Throat: Uvula is midline, oropharynx is clear and moist and mucous membranes are normal. No oral lesions. No uvula swelling or dental caries.  Eyes: Conjunctivae, EOM and lids are normal. Pupils are equal, round, and reactive to light. No scleral icterus.  Fundoscopic exam:      The right eye shows no arteriolar narrowing and no papilledema. The right eye shows red reflex.       The left eye shows no arteriolar narrowing and no papilledema. The left eye shows red reflex.  Neck: Trachea normal, normal range of motion, full passive range of motion without pain and phonation normal. Neck supple. No JVD present. No  spinous process tenderness and no muscular tenderness present. Carotid bruit is not present. No mass and no thyromegaly present.  Cardiovascular: Normal rate, regular rhythm, S1 normal, S2 normal, normal heart sounds and normal pulses.   No extrasystoles are present. PMI is not displaced.  Exam reveals no gallop and no friction rub.   No murmur heard. Pulmonary/Chest: Effort normal and breath sounds normal. No respiratory distress. She has no decreased breath sounds. She has no wheezes. Right breast exhibits no inverted nipple, no mass, no nipple discharge, no skin change and no tenderness. Left breast exhibits no inverted nipple, no mass, no nipple discharge, no skin change and no tenderness. Breasts are symmetrical.  Abdominal: Soft. Normal appearance and bowel sounds are normal. She exhibits no distension, no abdominal bruit and no pulsatile midline mass. There is no hepatosplenomegaly. There is no tenderness. There is no guarding and no CVA tenderness.  Genitourinary:  Deferred.  Musculoskeletal:       Left hip: She exhibits decreased strength, tenderness and bony tenderness. She exhibits no swelling and no deformity.       Cervical back: Normal.       Thoracic back: Normal.       Lumbar back: She exhibits tenderness and spasm. She exhibits no bony tenderness, no deformity and no pain.  Well healed anterior scar. Major joints and muscle groups remarkable for minor deg changes.  Lymphadenopathy:       Head (right side): No submental, no submandibular, no tonsillar, no preauricular, no posterior auricular and no occipital adenopathy present.       Head (left side): No submental, no submandibular, no tonsillar, no preauricular, no posterior auricular and no occipital adenopathy present.    She has no cervical adenopathy.    She has no axillary adenopathy.       Right: No inguinal and no supraclavicular adenopathy present.       Left: No inguinal and no supraclavicular adenopathy present.    Neurological: She is alert and oriented to person, place, and time. She has normal reflexes. She displays no atrophy and no tremor. No cranial nerve deficit or sensory deficit. She exhibits normal muscle tone. She displays a negative Romberg sign. Gait abnormal. Coordination normal.  Antalgic gait favoring L leg.  Skin: Skin is warm, dry and intact. No ecchymosis and no rash noted. She is not diaphoretic. No cyanosis or erythema. No pallor. Nails show no clubbing.  Psychiatric: Her speech is normal and behavior is normal. Judgment and thought content normal. Her affect is not labile and not inappropriate. Cognition and memory are normal. She exhibits a depressed mood.  Flat affect. PHQ-9 score= 5.       Assessment & Plan:  Routine general medical examination at a health care facility - Plan: COMPLETE METABOLIC PANEL  WITH GFR  Pelvic pain syndrome - Follow-up with [facility]TH[facility] for full assessment. May need some alternative PT or other therapy to reduce pain associated w/ scar tissue and adhesions. She can discuss this with [facility]TH[facility]. Plan: Vit D  25 hydroxy (rtn osteoporosis monitoring), COMPLETE METABOLIC PANEL WITH GFR  Obesity - Encouraged closer monitoring of caloric intake which pt has been tracking; maintain regular fitness routine as joint discomfort will allow. Encouraged return to swimming. Plan: Lipid panel, TSH

## 2013-10-08 LAB — COMPLETE METABOLIC PANEL WITH GFR
ALT: 18 U/L (ref 0–35)
AST: 22 U/L (ref 0–37)
Albumin: 4.6 g/dL (ref 3.5–5.2)
Alkaline Phosphatase: 57 U/L (ref 39–117)
BILIRUBIN TOTAL: 0.7 mg/dL (ref 0.2–1.2)
BUN: 13 mg/dL (ref 6–23)
CALCIUM: 9.5 mg/dL (ref 8.4–10.5)
CHLORIDE: 103 meq/L (ref 96–112)
CO2: 28 mEq/L (ref 19–32)
CREATININE: 0.83 mg/dL (ref 0.50–1.10)
GFR, Est Non African American: 81 mL/min
Glucose, Bld: 70 mg/dL (ref 70–99)
Potassium: 4.3 mEq/L (ref 3.5–5.3)
Sodium: 139 mEq/L (ref 135–145)
Total Protein: 6.7 g/dL (ref 6.0–8.3)

## 2013-10-08 LAB — LIPID PANEL
CHOLESTEROL: 202 mg/dL — AB (ref 0–200)
HDL: 78 mg/dL (ref >39)
LDL Cholesterol: 110 mg/dL — ABNORMAL HIGH (ref 0–99)
TRIGLYCERIDES: 71 mg/dL (ref <150)
Total CHOL/HDL Ratio: 2.6 Ratio
VLDL: 14 mg/dL (ref 0–40)

## 2013-10-08 LAB — VITAMIN D 25 HYDROXY (VIT D DEFICIENCY, FRACTURES): VIT D 25 HYDROXY: 49 ng/mL (ref 30–89)

## 2013-10-08 LAB — TSH: TSH: 1.91 u[IU]/mL (ref 0.350–4.500)

## 2013-10-09 NOTE — Progress Notes (Signed)
Quick Note:  Please notify pt that results are normal.   Provide pt with copy of labs. ______ 

## 2013-10-12 ENCOUNTER — Encounter: Payer: Self-pay | Admitting: Radiology

## 2013-11-01 LAB — HM MAMMOGRAPHY

## 2014-04-07 ENCOUNTER — Ambulatory Visit: Payer: Federal, State, Local not specified - PPO | Admitting: Family Medicine

## 2014-07-21 ENCOUNTER — Other Ambulatory Visit (INDEPENDENT_AMBULATORY_CARE_PROVIDER_SITE_OTHER): Payer: Federal, State, Local not specified - PPO

## 2014-07-21 ENCOUNTER — Encounter: Payer: Self-pay | Admitting: Internal Medicine

## 2014-07-21 ENCOUNTER — Ambulatory Visit (INDEPENDENT_AMBULATORY_CARE_PROVIDER_SITE_OTHER): Payer: Federal, State, Local not specified - PPO | Admitting: Internal Medicine

## 2014-07-21 VITALS — BP 120/72 | HR 56 | Temp 98.1°F | Resp 16 | Ht 66.0 in | Wt 207.2 lb

## 2014-07-21 DIAGNOSIS — Z0000 Encounter for general adult medical examination without abnormal findings: Secondary | ICD-10-CM | POA: Diagnosis not present

## 2014-07-21 DIAGNOSIS — E669 Obesity, unspecified: Secondary | ICD-10-CM

## 2014-07-21 LAB — COMPREHENSIVE METABOLIC PANEL
ALK PHOS: 57 U/L (ref 39–117)
ALT: 22 U/L (ref 0–35)
AST: 28 U/L (ref 0–37)
Albumin: 4.3 g/dL (ref 3.5–5.2)
BILIRUBIN TOTAL: 0.5 mg/dL (ref 0.2–1.2)
BUN: 16 mg/dL (ref 6–23)
CO2: 32 mEq/L (ref 19–32)
CREATININE: 0.8 mg/dL (ref 0.40–1.20)
Calcium: 9.8 mg/dL (ref 8.4–10.5)
Chloride: 105 mEq/L (ref 96–112)
GFR: 79.33 mL/min (ref >60.00)
Glucose, Bld: 76 mg/dL (ref 70–99)
POTASSIUM: 4.4 meq/L (ref 3.5–5.1)
Sodium: 140 mEq/L (ref 135–145)
Total Protein: 6.9 g/dL (ref 6.0–8.3)

## 2014-07-21 LAB — CBC
HEMATOCRIT: 39.2 % (ref 36.0–46.0)
HEMOGLOBIN: 13.3 g/dL (ref 12.0–15.0)
MCHC: 33.9 g/dL (ref 30.0–36.0)
MCV: 91.2 fl (ref 78.0–100.0)
Platelets: 207 10*3/uL (ref 150.0–400.0)
RBC: 4.3 Mil/uL (ref 3.87–5.11)
RDW: 13.6 % (ref 11.5–15.5)
WBC: 6.9 10*3/uL (ref 4.0–10.5)

## 2014-07-21 LAB — TSH: TSH: 1.44 u[IU]/mL (ref 0.35–4.50)

## 2014-07-21 LAB — HEMOGLOBIN A1C: HEMOGLOBIN A1C: 5.1 % (ref 4.6–6.5)

## 2014-07-21 NOTE — Assessment & Plan Note (Signed)
Checking labs today, up to date on mammogram. Pap not needed anymore. Colonoscopy up to date. Quit smoking a couple years ago (congratulated her on this and counseled to not resume). Talked to her about sun safety and reducing the risk for skin cancer.

## 2014-07-21 NOTE — Assessment & Plan Note (Signed)
Checking labs for complications today and talked to her about exercise as an important aspect to weight loss and not just diet. Declines the need for nutrition counseling at this time.

## 2014-07-21 NOTE — Patient Instructions (Signed)
We are going to check on your blood work and call you back with the results.   Come back in about 1 year and please feel free to call the office with any problems or questions.   Health Maintenance Adopting a healthy lifestyle and getting preventive care can go a long way to promote health and wellness. Talk with your health care provider about what schedule of regular examinations is right for you. This is a good chance for you to check in with your provider about disease prevention and staying healthy. In between checkups, there are plenty of things you can do on your own. Experts have done a lot of research about which lifestyle changes and preventive measures are most likely to keep you healthy. Ask your health care provider for more information. WEIGHT AND DIET  Eat a healthy diet  Be sure to include plenty of vegetables, fruits, low-fat dairy products, and lean protein.  Do not eat a lot of foods high in solid fats, added sugars, or salt.  Get regular exercise. This is one of the most important things you can do for your health.  Most adults should exercise for at least 150 minutes each week. The exercise should increase your heart rate and make you sweat (moderate-intensity exercise).  Most adults should also do strengthening exercises at least twice a week. This is in addition to the moderate-intensity exercise.  Maintain a healthy weight  Body mass index (BMI) is a measurement that can be used to identify possible weight problems. It estimates body fat based on height and weight. Your health care provider can help determine your BMI and help you achieve or maintain a healthy weight.  For females 20 years of age and older:   A BMI below 18.5 is considered underweight.  A BMI of 18.5 to 24.9 is normal.  A BMI of 25 to 29.9 is considered overweight.  A BMI of 30 and above is considered obese.  Watch levels of cholesterol and blood lipids  You should start having your blood  tested for lipids and cholesterol at 54 years of age, then have this test every 5 years.  You may need to have your cholesterol levels checked more often if:  Your lipid or cholesterol levels are high.  You are older than 54 years of age.  You are at high risk for heart disease.  CANCER SCREENING   Lung Cancer  Lung cancer screening is recommended for adults 101-87 years old who are at high risk for lung cancer because of a history of smoking.  A yearly low-dose CT scan of the lungs is recommended for people who:  Currently smoke.  Have quit within the past 15 years.  Have at least a 30-pack-year history of smoking. A pack year is smoking an average of one pack of cigarettes a day for 1 year.  Yearly screening should continue until it has been 15 years since you quit.  Yearly screening should stop if you develop a health problem that would prevent you from having lung cancer treatment.  Breast Cancer  Practice breast self-awareness. This means understanding how your breasts normally appear and feel.  It also means doing regular breast self-exams. Let your health care provider know about any changes, no matter how small.  If you are in your 20s or 30s, you should have a clinical breast exam (CBE) by a health care provider every 1-3 years as part of a regular health exam.  If you are 40  or older, have a CBE every year. Also consider having a breast X-ray (mammogram) every year.  If you have a family history of breast cancer, talk to your health care provider about genetic screening.  If you are at high risk for breast cancer, talk to your health care provider about having an MRI and a mammogram every year.  Breast cancer gene (BRCA) assessment is recommended for women who have family members with BRCA-related cancers. BRCA-related cancers include:  Breast.  Ovarian.  Tubal.  Peritoneal cancers.  Results of the assessment will determine the need for genetic counseling  and BRCA1 and BRCA2 testing. Cervical Cancer Routine pelvic examinations to screen for cervical cancer are no longer recommended for nonpregnant women who are considered low risk for cancer of the pelvic organs (ovaries, uterus, and vagina) and who do not have symptoms. A pelvic examination may be necessary if you have symptoms including those associated with pelvic infections. Ask your health care provider if a screening pelvic exam is right for you.   The Pap test is the screening test for cervical cancer for women who are considered at risk.  If you had a hysterectomy for a problem that was not cancer or a condition that could lead to cancer, then you no longer need Pap tests.  If you are older than 65 years, and you have had normal Pap tests for the past 10 years, you no longer need to have Pap tests.  If you have had past treatment for cervical cancer or a condition that could lead to cancer, you need Pap tests and screening for cancer for at least 20 years after your treatment.  If you no longer get a Pap test, assess your risk factors if they change (such as having a new sexual partner). This can affect whether you should start being screened again.  Some women have medical problems that increase their chance of getting cervical cancer. If this is the case for you, your health care provider may recommend more frequent screening and Pap tests.  The human papillomavirus (HPV) test is another test that may be used for cervical cancer screening. The HPV test looks for the virus that can cause cell changes in the cervix. The cells collected during the Pap test can be tested for HPV.  The HPV test can be used to screen women 29 years of age and older. Getting tested for HPV can extend the interval between normal Pap tests from three to five years.  An HPV test also should be used to screen women of any age who have unclear Pap test results.  After 54 years of age, women should have HPV testing  as often as Pap tests.  Colorectal Cancer  This type of cancer can be detected and often prevented.  Routine colorectal cancer screening usually begins at 54 years of age and continues through 54 years of age.  Your health care provider may recommend screening at an earlier age if you have risk factors for colon cancer.  Your health care provider may also recommend using home test kits to check for hidden blood in the stool.  A small camera at the end of a tube can be used to examine your colon directly (sigmoidoscopy or colonoscopy). This is done to check for the earliest forms of colorectal cancer.  Routine screening usually begins at age 21.  Direct examination of the colon should be repeated every 5-10 years through 54 years of age. However, you may need to  be screened more often if early forms of precancerous polyps or small growths are found. Skin Cancer  Check your skin from head to toe regularly.  Tell your health care provider about any new moles or changes in moles, especially if there is a change in a mole's shape or color.  Also tell your health care provider if you have a mole that is larger than the size of a pencil eraser.  Always use sunscreen. Apply sunscreen liberally and repeatedly throughout the day.  Protect yourself by wearing long sleeves, pants, a wide-brimmed hat, and sunglasses whenever you are outside. HEART DISEASE, DIABETES, AND HIGH BLOOD PRESSURE   Have your blood pressure checked at least every 1-2 years. High blood pressure causes heart disease and increases the risk of stroke.  If you are between 47 years and 52 years old, ask your health care provider if you should take aspirin to prevent strokes.  Have regular diabetes screenings. This involves taking a blood sample to check your fasting blood sugar level.  If you are at a normal weight and have a low risk for diabetes, have this test once every three years after 54 years of age.  If you are  overweight and have a high risk for diabetes, consider being tested at a younger age or more often. PREVENTING INFECTION  Hepatitis B  If you have a higher risk for hepatitis B, you should be screened for this virus. You are considered at high risk for hepatitis B if:  You were born in a country where hepatitis B is common. Ask your health care provider which countries are considered high risk.  Your parents were born in a high-risk country, and you have not been immunized against hepatitis B (hepatitis B vaccine).  You have HIV or AIDS.  You use needles to inject street drugs.  You live with someone who has hepatitis B.  You have had sex with someone who has hepatitis B.  You get hemodialysis treatment.  You take certain medicines for conditions, including cancer, organ transplantation, and autoimmune conditions. Hepatitis C  Blood testing is recommended for:  Everyone born from [birth year] through 1965.  Anyone with known risk factors for hepatitis C. Sexually transmitted infections (STIs)  You should be screened for sexually transmitted infections (STIs) including gonorrhea and chlamydia if:  You are sexually active and are younger than 54 years of age.  You are older than 54 years of age and your health care provider tells you that you are at risk for this type of infection.  Your sexual activity has changed since you were last screened and you are at an increased risk for chlamydia or gonorrhea. Ask your health care provider if you are at risk.  If you do not have HIV, but are at risk, it may be recommended that you take a prescription medicine daily to prevent HIV infection. This is called pre-exposure prophylaxis (PrEP). You are considered at risk if:  You are sexually active and do not regularly use condoms or know the HIV status of your partner(s).  You take drugs by injection.  You are sexually active with a partner who has HIV. Talk with your health care provider  about whether you are at high risk of being infected with HIV. If you choose to begin PrEP, you should first be tested for HIV. You should then be tested every 3 months for as long as you are taking PrEP.  PREGNANCY   If you are premenopausal and  you may become pregnant, ask your health care provider about preconception counseling.  If you may become pregnant, take 400 to 800 micrograms (mcg) of folic acid every day.  If you want to prevent pregnancy, talk to your health care provider about birth control (contraception). OSTEOPOROSIS AND MENOPAUSE   Osteoporosis is a disease in which the bones lose minerals and strength with aging. This can result in serious bone fractures. Your risk for osteoporosis can be identified using a bone density scan.  If you are 82 years of age or older, or if you are at risk for osteoporosis and fractures, ask your health care provider if you should be screened.  Ask your health care provider whether you should take a calcium or vitamin D supplement to lower your risk for osteoporosis.  Menopause may have certain physical symptoms and risks.  Hormone replacement therapy may reduce some of these symptoms and risks. Talk to your health care provider about whether hormone replacement therapy is right for you.  HOME CARE INSTRUCTIONS   Schedule regular health, dental, and eye exams.  Stay current with your immunizations.   Do not use any tobacco products including cigarettes, chewing tobacco, or electronic cigarettes.  If you are pregnant, do not drink alcohol.  If you are breastfeeding, limit how much and how often you drink alcohol.  Limit alcohol intake to no more than 1 drink per day for nonpregnant women. One drink equals 12 ounces of beer, 5 ounces of wine, or 1 ounces of hard liquor.  Do not use street drugs.  Do not share needles.  Ask your health care provider for help if you need support or information about quitting drugs.  Tell your  health care provider if you often feel depressed.  Tell your health care provider if you have ever been abused or do not feel safe at home. Document Released: 07/29/2010 Document Revised: 05/30/2013 Document Reviewed: 12/15/2012 [facility] Patient Information 2015 [place], [place]. This information is not intended to replace advice given to you by your health care provider. Make sure you discuss any questions you have with your health care provider.

## 2014-07-21 NOTE — Progress Notes (Signed)
   Subjective:    Patient ID: Nicole Wilcox, female    DOB: Sep 24, 1960, 54 y.o.   MRN: [id number]  HPI The patient is a 54 YO female coming in for wellness. No new complaints. Please see A/P for status and treatment of chronic medical conditions.   PMH, [facility], social history reviewed and updated.   Review of Systems  Constitutional: Negative.   HENT: Negative.   Eyes: Negative.   Respiratory: Negative for cough, chest tightness, shortness of breath and wheezing.   Cardiovascular: Negative for chest pain, palpitations and leg swelling.  Gastrointestinal: Negative for abdominal pain, diarrhea, constipation and abdominal distention.  Musculoskeletal: Positive for arthralgias. Negative for back pain and gait problem.  Skin: Negative.   Neurological: Negative.   Psychiatric/Behavioral: Negative.       Objective:   Physical Exam  Constitutional: She is oriented to person, place, and time. She appears well-developed and well-nourished.  Overweight  HENT:  Head: Normocephalic and atraumatic.  Eyes: EOM are normal.  Neck: Normal range of motion.  Cardiovascular: Normal rate and regular rhythm.   Pulmonary/Chest: Effort normal and breath sounds normal. No respiratory distress. She has no wheezes. She has no rales.  Abdominal: Soft. Bowel sounds are normal. She exhibits no distension. There is no tenderness. There is no rebound.  Musculoskeletal: She exhibits no edema.  Neurological: She is alert and oriented to person, place, and time.  Skin: Skin is warm and dry.  Psychiatric: She has a normal mood and affect.   Filed Vitals:   07/21/14 1339  BP: 120/72  Pulse: 56  Temp: 98.1 F (36.7 C)  TempSrc: Oral  Resp: 16  Height: 5\' 6"  (1.676 m)  Weight: 207 lb 3.2 oz (93.985 kg)  SpO2: 98%      Assessment & Plan:

## 2014-07-21 NOTE — Progress Notes (Signed)
Pre visit review using our clinic review tool, if applicable. No additional management support is needed unless otherwise documented below in the visit note. 

## 2014-08-22 ENCOUNTER — Encounter: Payer: Self-pay | Admitting: Family

## 2014-08-22 ENCOUNTER — Ambulatory Visit (INDEPENDENT_AMBULATORY_CARE_PROVIDER_SITE_OTHER): Payer: Federal, State, Local not specified - PPO | Admitting: Family

## 2014-08-22 VITALS — BP 138/84 | HR 60 | Temp 98.1°F | Resp 18 | Ht 66.25 in | Wt 208.0 lb

## 2014-08-22 DIAGNOSIS — F418 Other specified anxiety disorders: Secondary | ICD-10-CM | POA: Diagnosis not present

## 2014-08-22 DIAGNOSIS — F419 Anxiety disorder, unspecified: Principal | ICD-10-CM

## 2014-08-22 DIAGNOSIS — R635 Abnormal weight gain: Secondary | ICD-10-CM | POA: Insufficient documentation

## 2014-08-22 DIAGNOSIS — F329 Major depressive disorder, single episode, unspecified: Secondary | ICD-10-CM | POA: Insufficient documentation

## 2014-08-22 MED ORDER — BUPROPION HCL ER (XL) 150 MG PO TB24: ORAL_TABLET | ORAL | Status: DC

## 2014-08-22 NOTE — Assessment & Plan Note (Signed)
Notes 45 pound weight gain since her hysterectomy and cessation of smoking. Discussed importance of increasing physical activity which will dually help with decreasing her anxiety. Also to focus on nutrition eating nutrient dense foods and tracking her calories. Follow up after conservative interventions have been implemented.

## 2014-08-22 NOTE — Progress Notes (Signed)
Pre visit review using our clinic review tool, if applicable. No additional management support is needed unless otherwise documented below in the visit note. 

## 2014-08-22 NOTE — Patient Instructions (Signed)
Thank you for choosing [facility].  Summary/Instructions:  Your prescription(s) have been submitted to your pharmacy or been printed and provided for you. Please take as directed and contact our office if you believe you are having problem(s) with the medication(s) or have any questions.  If your symptoms worsen or fail to improve, please contact our office for further instruction, or in case of emergency go directly to the emergency room at the closest medical facility.   Depression Depression refers to feeling sad, low, down in the dumps, blue, gloomy, or empty. In general, there are two kinds of depression: 1. Normal sadness or normal grief. This kind of depression is one that we all feel from time to time after upsetting life experiences, such as the loss of a job or the ending of a relationship. This kind of depression is considered normal, is short lived, and resolves within a few days to 2 weeks. Depression experienced after the loss of a loved one (bereavement) often lasts longer than 2 weeks but normally gets better with time. 2. Clinical depression. This kind of depression lasts longer than normal sadness or normal grief or interferes with your ability to function at home, at work, and in school. It also interferes with your personal relationships. It affects almost every aspect of your life. Clinical depression is an illness. Symptoms of depression can also be caused by conditions other than those mentioned above, such as:  Physical illness. Some physical illnesses, including underactive thyroid gland (hypothyroidism), severe anemia, specific types of cancer, diabetes, uncontrolled seizures, heart and lung problems, strokes, and chronic pain are commonly associated with symptoms of depression.  Side effects of some prescription medicine. In some people, certain types of medicine can cause symptoms of depression.  Substance abuse. Abuse of alcohol and illicit drugs can cause  symptoms of depression. SYMPTOMS Symptoms of normal sadness and normal grief include the following:  Feeling sad or crying for short periods of time.  Not caring about anything (apathy).  Difficulty sleeping or sleeping too much.  No longer able to enjoy the things you used to enjoy.  Desire to be by oneself all the time (social isolation).  Lack of energy or motivation.  Difficulty concentrating or remembering.  Change in appetite or weight.  Restlessness or agitation. Symptoms of clinical depression include the same symptoms of normal sadness or normal grief and also the following symptoms:  Feeling sad or crying all the time.  Feelings of guilt or worthlessness.  Feelings of hopelessness or helplessness.  Thoughts of suicide or the desire to harm yourself (suicidal ideation).  Loss of touch with reality (psychotic symptoms). Seeing or hearing things that are not real (hallucinations) or having false beliefs about your life or the people around you (delusions and paranoia). DIAGNOSIS  The diagnosis of clinical depression is usually based on how bad the symptoms are and how long they have lasted. Your health care provider will also ask you questions about your medical history and substance use to find out if physical illness, use of prescription medicine, or substance abuse is causing your depression. Your health care provider may also order blood tests. TREATMENT  Often, normal sadness and normal grief do not require treatment. However, sometimes antidepressant medicine is given for bereavement to ease the depressive symptoms until they resolve. The treatment for clinical depression depends on how bad the symptoms are but often includes antidepressant medicine, counseling with a mental health professional, or both. Your health care provider will help to   determine what treatment is best for you. Depression caused by physical illness usually goes away with appropriate medical  treatment of the illness. If prescription medicine is causing depression, talk with your health care provider about stopping the medicine, decreasing the dose, or changing to another medicine. Depression caused by the abuse of alcohol or illicit drugs goes away when you stop using these substances. Some adults need professional help in order to stop drinking or using drugs. SEEK IMMEDIATE MEDICAL CARE IF:  You have thoughts about hurting yourself or others.  You lose touch with reality (have psychotic symptoms).  You are taking medicine for depression and have a serious side effect. FOR MORE INFORMATION  National Alliance on Mental Illness: www.nami.org  National Institute of Mental Health: www.nimh.nih.gov Document Released: 01/11/2000 Document Revised: 05/30/2013 Document Reviewed: 04/14/2011 [employer] Patient Information 2015 [employer]. This information is not intended to replace advice given to you by your health care provider. Make sure you discuss any questions you have with your health care provider.   

## 2014-08-22 NOTE — Assessment & Plan Note (Signed)
Symptoms described are consistent with a mix of anxiety and depression secondary to family stressors and self-image. Denies suicidal ideation. Start wellbutrin. Advised to discuss situation with a counselor that may available through her former employer. Follow up in about 1 month or sooner if needed.

## 2014-08-22 NOTE — Progress Notes (Signed)
Subjective:    Patient ID: Nicole Wilcox, female    DOB: [birth year], 54 y.o.   MRN: [id number]  Chief Complaint  Patient presents with  . Stress    Stressed out, feels like a "fat pig" can't lose weight, 3 years from not smoking and has been really close to starting again, states her life is in "shambles"    HPI:  Nicole Wilcox is a 54 y.o. female with a PMH of arthritis, tobacco use and hysterectomy who presents today for an acute office visit.   1.) Stress - Associated symptom of stress has been going on for several family issues going on including her son living in [place] and her husband has PTSD which has worsened recently. She previously coped with anxiety and stress through smoking which she ceased for about 3 years. Indicates that she had difficulties managing her stress at this time. Notes that she is totally withdrawn and or having a "mental meltdown." She had a previous hysterectomy she thinks her hormones may be playing a role in this.   2.) In ability to lose weight - Associated symptom of weight gain started about 3 years ago when she stopped smoking and notes a weight gain of about 45 pounds. She indicates that she does not eat a lot, however she does tend to graze. Currently physical activity includes gardening and walking the dog multiple times per day.   No Known Allergies   Current Outpatient Prescriptions on File Prior to Visit  Medication Sig Dispense Refill  . cholecalciferol (VITAMIN D) 400 UNITS TABS Take by mouth daily.    . Chromium Picolinate 500 MCG TABS Take by mouth daily.    . Multiple Vitamin (MULTIVITAMIN) tablet Take 1 tablet by mouth daily.    Marland Kitchen omeprazole (PRILOSEC) 20 MG capsule Take 1 capsule (20 mg total) by mouth daily. 30 capsule 3   No current facility-administered medications on file prior to visit.    Past Medical History  Diagnosis Date  . Arthritis     Review of Systems  Constitutional: Negative for fever and chills.    Psychiatric/Behavioral: Positive for dysphoric mood. Negative for suicidal ideas and sleep disturbance. The patient is nervous/anxious.       Objective:    BP 138/84 mmHg  Pulse 60  Temp(Src) 98.1 F (36.7 C) (Oral)  Resp 18  Ht 5' 6.25" (1.683 m)  Wt 208 lb (94.348 kg)  BMI 33.31 kg/m2  SpO2 98% Nursing note and vital signs reviewed.  Physical Exam  Constitutional: She is oriented to person, place, and time. She appears well-developed and well-nourished. No distress.  Cardiovascular: Normal rate, regular rhythm, normal heart sounds and intact distal pulses.   Pulmonary/Chest: Effort normal and breath sounds normal.  Neurological: She is alert and oriented to person, place, and time.  Skin: Skin is warm and dry.  Psychiatric: She has a normal mood and affect. Her behavior is normal. Judgment and thought content normal.       Assessment & Plan:   Problem List Items Addressed This Visit      Other   Anxiety and depression - Primary    Symptoms described are consistent with a mix of anxiety and depression secondary to family stressors and self-image. Denies suicidal ideation. Start wellbutrin. Advised to discuss situation with a counselor that may available through her former employer. Follow up in about 1 month or sooner if needed.       Relevant Medications   buPROPion (WELLBUTRIN XL) 150  MG 24 hr tablet   Weight gain    Notes 45 pound weight gain since her hysterectomy and cessation of smoking. Discussed importance of increasing physical activity which will dually help with decreasing her anxiety. Also to focus on nutrition eating nutrient dense foods and tracking her calories. Follow up after conservative interventions have been implemented.

## 2014-09-28 ENCOUNTER — Ambulatory Visit (INDEPENDENT_AMBULATORY_CARE_PROVIDER_SITE_OTHER): Payer: Federal, State, Local not specified - PPO | Admitting: Internal Medicine

## 2014-09-28 ENCOUNTER — Encounter: Payer: Self-pay | Admitting: Internal Medicine

## 2014-09-28 VITALS — BP 158/96 | HR 57 | Temp 98.5°F | Resp 14 | Ht 66.0 in | Wt 210.0 lb

## 2014-09-28 DIAGNOSIS — F418 Other specified anxiety disorders: Secondary | ICD-10-CM | POA: Diagnosis not present

## 2014-09-28 DIAGNOSIS — F419 Anxiety disorder, unspecified: Principal | ICD-10-CM

## 2014-09-28 DIAGNOSIS — F32A Depression, unspecified: Secondary | ICD-10-CM

## 2014-09-28 DIAGNOSIS — F329 Major depressive disorder, single episode, unspecified: Secondary | ICD-10-CM

## 2014-09-28 MED ORDER — BUPROPION HCL ER (XL) 150 MG PO TB24: 150.0000 mg | ORAL_TABLET | Freq: Three times a day (TID) | ORAL | Status: DC

## 2014-09-28 MED ORDER — BUPROPION HCL ER (XL) 150 MG PO TB24: 300.0000 mg | ORAL_TABLET | Freq: Three times a day (TID) | ORAL | Status: DC

## 2014-09-28 NOTE — Patient Instructions (Signed)
We will increase your wellbutrin to 3 times a day and see if this helps more. Call us back in 1 month and if you are not doing better we may need to change medicines.   Think about doing some counseling or going to alanon for extra support and for others in your same situation that you can talk to.   The biggest thing to try to accept is that you cannot beat any of your husband's trials (if you could you would have years ago) and focus on the trials you are having and work on you first.   Stress and Stress Management Stress is a normal reaction to life events. It is what you feel when life demands more than you are used to or more than you can handle. Some stress can be useful. For example, the stress reaction can help you catch the last bus of the day, study for a test, or meet a deadline at work. But stress that occurs too often or for too long can cause problems. It can affect your emotional health and interfere with relationships and normal daily activities. Too much stress can weaken your immune system and increase your risk for physical illness. If you already have a medical problem, stress can make it worse. CAUSES  All sorts of life events may cause stress. An event that causes stress for one person may not be stressful for another person. Major life events commonly cause stress. These may be positive or negative. Examples include losing your job, moving into a new home, getting married, having a baby, or losing a loved one. Less obvious life events may also cause stress, especially if they occur day after day or in combination. Examples include working long hours, driving in traffic, caring for children, being in debt, or being in a difficult relationship. SIGNS AND SYMPTOMS Stress may cause emotional symptoms including, the following:  Anxiety. This is feeling worried, afraid, on edge, overwhelmed, or out of control.  Anger. This is feeling irritated or impatient.  Depression. This is  feeling sad, down, helpless, or guilty.  Difficulty focusing, remembering, or making decisions. Stress may cause physical symptoms, including the following:   Aches and pains. These may affect your head, neck, back, stomach, or other areas of your body.  Tight muscles or clenched jaw.  Low energy or trouble sleeping. Stress may cause unhealthy behaviors, including the following:   Eating to feel better (overeating) or skipping meals.  Sleeping too little, too much, or both.  Working too much or putting off tasks (procrastination).  Smoking, drinking alcohol, or using drugs to feel better. DIAGNOSIS  Stress is diagnosed through an assessment by your health care provider. Your health care provider will ask questions about your symptoms and any stressful life events.Your health care provider will also ask about your medical history and may order blood tests or other tests. Certain medical conditions and medicine can cause physical symptoms similar to stress. Mental illness can cause emotional symptoms and unhealthy behaviors similar to stress. Your health care provider may refer you to a mental health professional for further evaluation.  TREATMENT  Stress management is the recommended treatment for stress.The goals of stress management are reducing stressful life events and coping with stress in healthy ways.  Techniques for reducing stressful life events include the following:  Stress identification. Self-monitor for stress and identify what causes stress for you. These skills may help you to avoid some stressful events.  Time management. Set your priorities,  keep a calendar of events, and learn to say "no." These tools can help you avoid making too many commitments. Techniques for coping with stress include the following:  Rethinking the problem. Try to think realistically about stressful events rather than ignoring them or overreacting. Try to find the positives in a stressful  situation rather than focusing on the negatives.  Exercise. Physical exercise can release both physical and emotional tension. The key is to find a form of exercise you enjoy and do it regularly.  Relaxation techniques. These relax the body and mind. Examples include yoga, meditation, tai chi, biofeedback, deep breathing, progressive muscle relaxation, listening to music, being out in nature, journaling, and other hobbies. Again, the key is to find one or more that you enjoy and can do regularly.  Healthy lifestyle. Eat a balanced diet, get plenty of sleep, and do not smoke. Avoid using alcohol or drugs to relax.  Strong support network. Spend time with family, friends, or other people you enjoy being around.Express your feelings and talk things over with someone you trust. Counseling or talktherapy with a mental health professional may be helpful if you are having difficulty managing stress on your own. Medicine is typically not recommended for the treatment of stress.Talk to your health care provider if you think you need medicine for symptoms of stress. HOME CARE INSTRUCTIONS  Keep all follow-up visits as directed by your health care provider.  Take all medicines as directed by your health care provider. SEEK MEDICAL CARE IF:  Your symptoms get worse or you start having new symptoms.  You feel overwhelmed by your problems and can no longer manage them on your own. SEEK IMMEDIATE MEDICAL CARE IF:  You feel like hurting yourself or someone else. Document Released: 07/09/2000 Document Revised: 05/30/2013 Document Reviewed: 09/07/2012 [facility] Patient Information 2015 [place], [place]. This information is not intended to replace advice given to you by your health care provider. Make sure you discuss any questions you have with your health care provider.

## 2014-09-28 NOTE — Assessment & Plan Note (Signed)
Will increase wellbutrin to TID 150 mg and see if that is more effective. Talked to her about alanon as well as counseling. Talked to her about the fact that she cannot change other people's problems and addiction. She is open to starting counseling. We also discussed taking more time for herself and doing activities that she likes and enjoys.

## 2014-09-28 NOTE — Progress Notes (Signed)
   Subjective:    Patient ID: Nicole Wilcox, female    DOB: [birth year], 54 y.o.   MRN: [id number]  HPI The patient is a 54 YO female coming in for follow up of a new medicine for anxiety and depression. She was started on wellbutrin for some intense social stressors in her life. She has been struggling with her husband's PTSD and recurrent alcohol and drug use. He is now abusing again and causing her increased stress as well as increasing verbal strain from him. She does not feel able to escape and does not have activities for herself. She tries to get away to the grocery store and feels rushed. She is not exercising and is very depressed about her weight. She has noticed some of the edge is taken away with the wellbutrin but does not feel that it is doing enough. Denies SI/HI.  Review of Systems  Constitutional: Negative.   HENT: Negative.   Eyes: Negative.   Respiratory: Negative for cough, chest tightness, shortness of breath and wheezing.   Cardiovascular: Negative for chest pain, palpitations and leg swelling.  Gastrointestinal: Negative for abdominal pain, diarrhea, constipation and abdominal distention.  Musculoskeletal: Positive for arthralgias. Negative for back pain and gait problem.  Skin: Negative.   Neurological: Negative.   Psychiatric/Behavioral: Positive for dysphoric mood, decreased concentration and agitation. Negative for suicidal ideas, hallucinations, behavioral problems, confusion, sleep disturbance and self-injury. The patient is nervous/anxious. The patient is not hyperactive.       Objective:   Physical Exam  Constitutional: She is oriented to person, place, and time. She appears well-developed and well-nourished.  Overweight  HENT:  Head: Normocephalic and atraumatic.  Eyes: EOM are normal.  Neck: Normal range of motion.  Cardiovascular: Normal rate and regular rhythm.   Pulmonary/Chest: Effort normal and breath sounds normal. No respiratory distress. She has no  wheezes. She has no rales.  Abdominal: Soft. Bowel sounds are normal. She exhibits no distension. There is no tenderness. There is no rebound.  Musculoskeletal: She exhibits no edema.  Neurological: She is alert and oriented to person, place, and time.  Skin: Skin is warm and dry.  Psychiatric:  Slightly tearful during visit.   Filed Vitals:   09/28/14 0844  BP: 158/96  Pulse: 57  Temp: 98.5 F (36.9 C)  TempSrc: Oral  Resp: 14  Height:  (1.676 m)  Weight: 210 lb (95.255 kg)  SpO2: 98%      Assessment & Plan:

## 2014-09-28 NOTE — Progress Notes (Signed)
Pre visit review using our clinic review tool, if applicable. No additional management support is needed unless otherwise documented below in the visit note. 

## 2014-09-29 ENCOUNTER — Telehealth: Payer: Self-pay | Admitting: Internal Medicine

## 2014-09-29 NOTE — Telephone Encounter (Signed)
Pt called in said that she is wondering if the Wellbutrin is causing her to have high bp?  She said that she normally has low bp and it was high when she came in?      Best number [phone]

## 2014-10-03 MED ORDER — SERTRALINE HCL 50 MG PO TABS: 50.0000 mg | ORAL_TABLET | Freq: Every day | ORAL | Status: DC

## 2014-10-03 NOTE — Telephone Encounter (Signed)
Patient stopped her Wellbutrin. She said the headaches are gone now. Are there any further instructions?

## 2014-10-03 NOTE — Addendum Note (Signed)
Addended by: Genella Mech A on: 10/03/2014 01:50 PM   Modules accepted: Orders

## 2014-10-03 NOTE — Telephone Encounter (Signed)
The blood pressure being up is more likely related to the stress and not the medicine. We will continue to watch the blood pressure as it was not different from usual.

## 2014-10-03 NOTE — Telephone Encounter (Signed)
Patient aware and will go pick up 

## 2014-10-03 NOTE — Telephone Encounter (Signed)
I would strongly recommend something to replace the wellbutrin and have sent in a medicine for her. This is for anxiety and depression. Called sertraline and she will take 1 pill daily.

## 2014-10-12 ENCOUNTER — Encounter (HOSPITAL_COMMUNITY): Payer: Self-pay | Admitting: *Deleted

## 2014-12-28 ENCOUNTER — Ambulatory Visit: Payer: Self-pay | Admitting: Internal Medicine

## 2015-01-16 ENCOUNTER — Encounter: Payer: Self-pay | Admitting: Internal Medicine

## 2015-03-14 ENCOUNTER — Ambulatory Visit (INDEPENDENT_AMBULATORY_CARE_PROVIDER_SITE_OTHER): Payer: Federal, State, Local not specified - PPO | Admitting: Nurse Practitioner

## 2015-03-14 ENCOUNTER — Encounter: Payer: Self-pay | Admitting: Nurse Practitioner

## 2015-03-14 VITALS — BP 140/90 | HR 79 | Temp 98.9°F | Ht 66.0 in | Wt 198.2 lb

## 2015-03-14 DIAGNOSIS — B9789 Other viral agents as the cause of diseases classified elsewhere: Principal | ICD-10-CM

## 2015-03-14 DIAGNOSIS — J069 Acute upper respiratory infection, unspecified: Secondary | ICD-10-CM

## 2015-03-14 MED ORDER — AZITHROMYCIN 250 MG PO TABS: ORAL_TABLET | ORAL | Status: DC

## 2015-03-14 MED ORDER — METHYLPREDNISOLONE 4 MG PO TABS: ORAL_TABLET | ORAL | Status: DC

## 2015-03-14 NOTE — Patient Instructions (Addendum)
Prednisone with breakfast or lunch at the latest.  6 tablets on day 1, 5 tablets on day 2, 4 tablets on day 3, 3 tablets on day 4, 2 tablets day 5, 1 tablet on day 6...done! Take tablets all together not spaced out Don't take with NSAIDs (Ibuprofen, Aleve, Naproxen, Meloxicam ect...)  Z-pack as directed.   This cough will continue even after treatment, but will go away in a few weeks.

## 2015-03-14 NOTE — Progress Notes (Signed)
Patient ID: Nicole Wilcox, female    DOB: 18-Jun-1960  Age: 55 y.o. MRN: [id number]  CC: Laryngitis and Cough   HPI Nicole Wilcox presents for CC of laryngitis and cough.   1) 03/06/15 laryngitis improved  Cough-green  Nasal drainage  Tx to date: Benadryl  Cold/flu OTC medicine daytime and nighttime   Sick contacts: Husband, granddaughter- daycare   History Nicole Wilcox has a past medical history of Arthritis.   She has past surgical history that includes Gallbladder surgery; Inguinal hernia repair; Total vaginal hysterectomy; Hip surgery; Cholecystectomy; Hernia repair; and Abdominal hysterectomy.   Her family history includes Asthma in her mother; Cancer in her father and paternal grandfather; Diabetes in her brother and maternal grandmother; Heart disease in her maternal grandfather; Heart failure in her mother; Hypertension in her maternal grandmother; Stroke in her maternal grandmother.She reports that she has quit smoking. Her smoking use included Cigarettes. She smoked 1.00 pack per day. She does not have any smokeless tobacco history on file. She reports that she does not drink alcohol or use illicit drugs.  Outpatient Prescriptions Prior to Visit  Medication Sig Dispense Refill  . cholecalciferol (VITAMIN D) 400 UNITS TABS Take by mouth daily.    . Chromium Picolinate 500 MCG TABS Take by mouth daily.    . Multiple Vitamin (MULTIVITAMIN) tablet Take 1 tablet by mouth daily.    Marland Kitchen buPROPion (WELLBUTRIN XL) 150 MG 24 hr tablet Take 1 tablet (150 mg total) by mouth 3 (three) times daily. 90 tablet 6  . sertraline (ZOLOFT) 50 MG tablet Take 1 tablet (50 mg total) by mouth daily. 30 tablet 3   No facility-administered medications prior to visit.    ROS Review of Systems  Constitutional: Positive for chills. Negative for fever, diaphoresis and fatigue.  HENT: Positive for congestion, ear pain, postnasal drip, rhinorrhea, sneezing and voice change. Negative for sinus pressure, sore  throat and trouble swallowing.   Eyes: Positive for discharge and itching. Negative for photophobia, redness and visual disturbance.  Respiratory: Negative for chest tightness, shortness of breath and wheezing.   Cardiovascular: Negative for chest pain, palpitations and leg swelling.  Gastrointestinal: Negative for nausea, vomiting and diarrhea.  Musculoskeletal: Positive for myalgias.  Skin: Negative for rash.  Neurological: Negative for dizziness and headaches.    Objective:  BP 140/90 mmHg  Pulse 79  Temp(Src) 98.9 F (37.2 C) (Oral)  Ht  (1.676 m)  Wt 198 lb 4 oz (89.926 kg)  BMI 32.01 kg/m2  SpO2 94%  Physical Exam  Constitutional: She is oriented to person, place, and time. She appears well-developed and well-nourished. No distress.  HENT:  Head: Normocephalic and atraumatic.  Right Ear: External ear normal.  Left Ear: External ear normal.  Mouth/Throat: No oropharyngeal exudate.  TMs clear bilaterally  Eyes: EOM are normal. Pupils are equal, round, and reactive to light. Right eye exhibits no discharge. Left eye exhibits no discharge. No scleral icterus.  Neck: Normal range of motion. Neck supple.  Cardiovascular: Normal rate, regular rhythm and normal heart sounds.  Exam reveals no gallop and no friction rub.   No murmur heard. Pulmonary/Chest: Effort normal and breath sounds normal. No respiratory distress. She has no wheezes. She has no rales. She exhibits no tenderness.  Lymphadenopathy:    She has no cervical adenopathy.  Neurological: She is alert and oriented to person, place, and time.  Skin: Skin is warm and dry. No rash noted. She is not diaphoretic.  Psychiatric: She has a normal  mood and affect. Her behavior is normal. Judgment and thought content normal.   Assessment & Plan:   Nicole Wilcox was seen today for laryngitis and cough.  Diagnoses and all orders for this visit:  Viral URI with cough  Other orders -     methylPREDNISolone (MEDROL) 4 MG  tablet; Take 6 tablets by mouth with breakfast or lunch and decrease by 1 tablet each day until gone. -     azithromycin (ZITHROMAX) 250 MG tablet; Take 2 tablets by mouth on day 1, take 1 tablet by mouth each day after for 4 days.   I have discontinued Nicole Wilcox's buPROPion and sertraline. I am also having her start on methylPREDNISolone and azithromycin. Additionally, I am having her maintain her multivitamin, cholecalciferol, and Chromium Picolinate.  Meds ordered this encounter  Medications  . methylPREDNISolone (MEDROL) 4 MG tablet    Sig: Take 6 tablets by mouth with breakfast or lunch and decrease by 1 tablet each day until gone.    Dispense:  21 tablet    Refill:  0    Order Specific Question:  Supervising Provider    Answer:  Duncan Dull L [[id number]]  . azithromycin (ZITHROMAX) 250 MG tablet    Sig: Take 2 tablets by mouth on day 1, take 1 tablet by mouth each day after for 4 days.    Dispense:  6 each    Refill:  0    Order Specific Question:  Supervising Provider    Answer:  Sherlene Shams [[id number]]    Follow-up: Return if symptoms worsen or fail to improve.

## 2015-03-14 NOTE — Progress Notes (Signed)
Pre visit review using our clinic review tool, if applicable. No additional management support is needed unless otherwise documented below in the visit note. 

## 2015-03-19 DIAGNOSIS — J069 Acute upper respiratory infection, unspecified: Secondary | ICD-10-CM | POA: Insufficient documentation

## 2015-03-19 DIAGNOSIS — B9789 Other viral agents as the cause of diseases classified elsewhere: Principal | ICD-10-CM

## 2015-03-19 NOTE — Assessment & Plan Note (Signed)
New onset Will treat conservatively due to probable viral nature Prednisone taper to decrease inflammation given  Instructions for taking done verbally and on AVS Flonase was sent to pharmacy  Mucinex plain and benadryl at night encouraged  Antibiotic prescription sent to pharmacy in case of no improvement or fever within 48 hours. Pt verbalized understanding of need to hold.  FU prn worsening/failure to improve.

## 2015-05-30 ENCOUNTER — Telehealth: Payer: Self-pay | Admitting: Internal Medicine

## 2015-05-30 NOTE — Telephone Encounter (Signed)
Spoke to patient. Sending an email to our billing dept to ask that they remove the no show fee

## 2015-05-30 NOTE — Telephone Encounter (Signed)
Pt request to speak to the assistant concern about no show bill. Pt stated the no show date and 12/28/14, pt stated she cancel the appt on 12/28/14 and got the appt to see Dr. Okey Duprerawford on 12/26/14 ( can not find it in the system). Please help  # [id number]

## 2015-09-07 ENCOUNTER — Telehealth: Payer: Self-pay | Admitting: Internal Medicine

## 2015-09-07 ENCOUNTER — Ambulatory Visit (INDEPENDENT_AMBULATORY_CARE_PROVIDER_SITE_OTHER): Payer: Federal, State, Local not specified - PPO | Admitting: Adult Health

## 2015-09-07 ENCOUNTER — Encounter: Payer: Self-pay | Admitting: Adult Health

## 2015-09-07 VITALS — BP 150/92

## 2015-09-07 DIAGNOSIS — R42 Dizziness and giddiness: Secondary | ICD-10-CM | POA: Diagnosis not present

## 2015-09-07 DIAGNOSIS — E162 Hypoglycemia, unspecified: Secondary | ICD-10-CM

## 2015-09-07 LAB — GLUCOSE, POCT (MANUAL RESULT ENTRY): POC GLUCOSE: 79 mg/dL (ref 70–99)

## 2015-09-07 NOTE — Progress Notes (Signed)
Subjective:    Patient ID: Nicole Wilcox, female    DOB: 04/12/1960, 55 y.o.   MRN: [id number]  HPI  55 year old female who  has a past medical history of Arthritis. She is a patient of Dr. Okey Dupre. Sobia presents to the office today with the complaint of one week of intermittent episodes of dizziness. She reports that during these episodes " it is like a blanket is being pulled over my eyes and I become sweaty." She feels that at times she has to hold onto a piece of furniture so that she does not fall onto the ground. She denies any syncopal episodes. This has never been an issue with her before.   She has found that when she has these dizzy spells that if she eats something like a piece of bread then about 20 minutes later she is feeling better. When she has a piece of candy then she almost feels immediately better.   She does not eat during the day and only has coffee in the morning and afternoon. Her dinner is a large meal.   While in our waiting room she became dizzy and diaphoretic. Her husband went out to the car and brought her a piece of candy. She reports that this made her feel " back to normal."  She does endorse having a slow heart rate and has been worked up in the past for this   Review of Systems  Constitutional: Positive for activity change and diaphoresis. Negative for appetite change and fever.  HENT: Negative.   Eyes: Positive for visual disturbance.  Respiratory: Negative.   Cardiovascular: Negative.   Gastrointestinal: Negative.   Neurological: Positive for dizziness.  All other systems reviewed and are negative.  Past Medical History:  Diagnosis Date  . Arthritis     Social History   Social History  . Marital status: Married    Spouse name: N/A  . Number of children: N/A  . Years of education: N/A   Occupational History  . retired [employer]   Social History Main Topics  . Smoking status: Former Smoker    Packs/day: 1.00    Types:  Cigarettes  . Smokeless tobacco: Not on file     Comment: quit 5 months ago  . Alcohol use No  . Drug use: No  . Sexual activity: Yes    Partners: Male   Other Topics Concern  . Not on file   Social History Narrative   Exercises alittle.    Past Surgical History:  Procedure Laterality Date  . ABDOMINAL HYSTERECTOMY    . CHOLECYSTECTOMY    . GALLBLADDER SURGERY    . HERNIA REPAIR    . HIP SURGERY     S/P 3 separate hip surgeries in 2004  . INGUINAL HERNIA REPAIR    . TOTAL VAGINAL HYSTERECTOMY      Family History  Problem Relation Age of Onset  . Heart failure Mother   . Asthma Mother   . Diabetes Brother   . Cancer Father   . Diabetes Maternal Grandmother   . Hypertension Maternal Grandmother   . Stroke Maternal Grandmother   . Heart disease Maternal Grandfather   . Cancer Paternal Grandfather     No Known Allergies  Current Outpatient Prescriptions on File Prior to Visit  Medication Sig Dispense Refill  . cholecalciferol (VITAMIN D) 400 UNITS TABS Take by mouth daily.    . Chromium Picolinate 500 MCG TABS Take by mouth daily.    Marland Kitchen  Multiple Vitamin (MULTIVITAMIN) tablet Take 1 tablet by mouth daily.     No current facility-administered medications on file prior to visit.     BP (!) 150/92       Objective:   Physical Exam  Constitutional: She is oriented to person, place, and time. She appears well-developed and well-nourished. No distress.  HENT:  Head: Normocephalic and atraumatic.  Right Ear: External ear normal.  Left Ear: External ear normal.  Nose: Nose normal.  Mouth/Throat: Oropharynx is clear and moist.  Eyes: Conjunctivae and EOM are normal. Pupils are equal, round, and reactive to light. Right eye exhibits no discharge. Left eye exhibits no discharge. No scleral icterus.  Cardiovascular: Normal rate, regular rhythm, normal heart sounds and intact distal pulses.  Exam reveals no gallop and no friction rub.   No murmur heard. Pulmonary/Chest:  Effort normal and breath sounds normal. No respiratory distress. She has no wheezes. She has no rales. She exhibits no tenderness.  Neurological: She is alert and oriented to person, place, and time.  Skin: Skin is warm and dry. No rash noted. She is not diaphoretic. No erythema. No pallor.  Psychiatric: She has a normal mood and affect. Her behavior is normal. Judgment and thought content normal.  Nursing note and vitals reviewed.     Assessment & Plan:  1. Dizzy - EKG 12-Lead- Marked sinus  Bradycardia  -Left atrial enlargement.   -  Negative T-waves  -May be normal -possible  Anteroseptal/anterior ischemia.  Rate 46 - POC Glucose (CBG) - 79 - after candy  2. Hypoglycemia - Advised that she needs to eat a small meal four times per day.  - Cut back on coffee - Insulin Free and Total - Proinsulin - Sulfonylurea Hypoglycemics Panel, blood - CBC with Differential/Platelet - Basic metabolic panel - Follow up with Dr. Ival Biblerawford  Ema Hebner, NP

## 2015-09-07 NOTE — Telephone Encounter (Signed)
Patient Name: Augusto GarbeCONNIE Broy  DOB: 01/09/1961    Initial Comment Caller would like an appt for dizzy spells- not sure if it is her BS or not. It fluctuates   Nurse Assessment  Nurse: Sherilyn CooterHenry, RN, Thurmond ButtsWade Date/Time Lamount Cohen(Eastern Time): 09/07/2015 10:17:03 AM  Confirm and document reason for call. If symptomatic, describe symptoms. You must click the next button to save text entered. ---Caller states that she has been having dizzy spells for over a week. The spell usually lasts until she eats something. She is not a diabetic. When she has the dizzy spell, she usually has to hold onto something to walk. She does have a dizzy spell right now.  Has the patient traveled out of the country within the last 30 days? ---No  Does the patient have any new or worsening symptoms? ---Yes  Will a triage be completed? ---Yes  Related visit to physician within the last 2 weeks? ---No  Does the PT have any chronic conditions? (i.e. diabetes, asthma, etc.) ---No  Is this a behavioral health or substance abuse call? ---No     Guidelines    Guideline Title Affirmed Question Affirmed Notes  Dizziness - Lightheadedness SEVERE dizziness (e.g., unable to stand, requires support to walk, feels like passing out now)    Final Disposition User   Go to ED Now (or PCP triage) Sherilyn CooterHenry, RN, Thurmond ButtsWade    Comments  I was not able to schedule [facility], they are all booked up. I was able to schedule her to be seen by Shirline Freesory Nafziger at 2:45pm today.   Referrals  REFERRED TO PCP OFFICE   Disagree/Comply: Comply

## 2015-09-07 NOTE — Patient Instructions (Addendum)
It was great meeting you today  I believe that you are feeling like this because your blood sugars are going low.   It is important that you eat throughout the day to keep your blood sugars at normal limits  I will follow up with you regarding your blood work   Please make an appointment to follow up with Dr. Okey Dupre  Hypoglycemia Hypoglycemia occurs when the glucose in your blood is too low. Glucose is a type of sugar that is your body's main energy source. Hormones, such as insulin and glucagon, control the level of glucose in the blood. Insulin lowers blood glucose and glucagon increases blood glucose. Having too much insulin in your blood stream, or not eating enough food containing sugar, can result in hypoglycemia. Hypoglycemia can happen to people with or without diabetes. It can develop quickly and can be a medical emergency.  CAUSES   Missing or delaying meals.  Not eating enough carbohydrates at meals.  Taking too much diabetes medicine.  Not timing your oral diabetes medicine or insulin doses with meals, snacks, and exercise.  Nausea and vomiting.  Certain medicines.  Severe illnesses, such as hepatitis, kidney disorders, and certain eating disorders.  Increased activity or exercise without eating something extra or adjusting medicines.  Drinking too much alcohol.  A nerve disorder that affects body functions like your heart rate, blood pressure, and digestion (autonomic neuropathy).  A condition where the stomach muscles do not function properly (gastroparesis). Therefore, medicines and food may not absorb properly.  Rarely, a tumor of the pancreas can produce too much insulin. SYMPTOMS   Hunger.  Sweating (diaphoresis).  Change in body temperature.  Shakiness.  Headache.  Anxiety.  Lightheadedness.  Irritability.  Difficulty concentrating.  Dry mouth.  Tingling or numbness in the hands or feet.  Restless sleep or sleep  disturbances.  Altered speech and coordination.  Change in mental status.  Seizures or prolonged convulsions.  Combativeness.  Drowsiness (lethargic).  Weakness.  Increased heart rate or palpitations.  Confusion.  Pale, gray skin color.  Blurred or double vision.  Fainting. DIAGNOSIS  A physical exam and medical history will be performed. Your caregiver may make a diagnosis based on your symptoms. Blood tests and other lab tests may be performed to confirm a diagnosis. Once the diagnosis is made, your caregiver will see if your signs and symptoms go away once your blood glucose is raised.  TREATMENT  Usually, you can easily treat your hypoglycemia when you notice symptoms.  Check your blood glucose. If it is less than 70 mg/dl, take one of the following:   3-4 glucose tablets.    cup juice.    cup regular soda.   1 cup skim milk.   -1 tube of glucose gel.   5-6 hard candies.   Avoid high-fat drinks or food that may delay a rise in blood glucose levels.  Do not take more than the recommended amount of sugary foods, drinks, gel, or tablets. Doing so will cause your blood glucose to go too high.   Wait 10-15 minutes and recheck your blood glucose. If it is still less than 70 mg/dl or below your target range, repeat treatment.   Eat a snack if it is more than 1 hour until your next meal.  There may be a time when your blood glucose may go so low that you are unable to treat yourself at home when you start to notice symptoms. You may need someone to help you.  You may even faint or be unable to swallow. If you cannot treat yourself, someone will need to bring you to the hospital.  HOME CARE INSTRUCTIONS  If you have diabetes, follow your diabetes management plan by:  Taking your medicines as directed.  Following your exercise plan.  Following your meal plan. Do not skip meals. Eat on time.  Testing your blood glucose regularly. Check your blood  glucose before and after exercise. If you exercise longer or different than usual, be sure to check blood glucose more frequently.  Wearing your medical alert jewelry that says you have diabetes.  Identify the cause of your hypoglycemia. Then, develop ways to prevent the recurrence of hypoglycemia.  Do not take a hot bath or shower right after an insulin shot.  Always carry treatment with you. Glucose tablets are the easiest to carry.  If you are going to drink alcohol, drink it only with meals.  Tell friends or family members ways to keep you safe during a seizure. This may include removing hard or sharp objects from the area or turning you on your side.  Maintain a healthy weight. SEEK MEDICAL CARE IF:   You are having problems keeping your blood glucose in your target range.  You are having frequent episodes of hypoglycemia.  You feel you might be having side effects from your medicines.  You are not sure why your blood glucose is dropping so low.  You notice a change in vision or a new problem with your vision. SEEK IMMEDIATE MEDICAL CARE IF:   Confusion develops.  A change in mental status occurs.  The inability to swallow develops.  Fainting occurs.   This information is not intended to replace advice given to you by your health care provider. Make sure you discuss any questions you have with your health care provider.   Document Released: 01/13/2005 Document Revised: 01/18/2013 Document Reviewed: 09/19/2014 Elsevier Interactive Patient Education [employer]2016 Elsevier Inc.

## 2015-09-08 LAB — CBC WITH DIFFERENTIAL/PLATELET
BASOS ABS: 0 {cells}/uL (ref 0–200)
Basophils Relative: 0 %
EOS PCT: 3 %
Eosinophils Absolute: 183 cells/uL (ref 15–500)
HCT: 39.8 % (ref 35.0–45.0)
Hemoglobin: 13.1 g/dL (ref 11.7–15.5)
LYMPHS ABS: 1891 {cells}/uL (ref 850–3900)
Lymphocytes Relative: 31 %
MCH: 30.7 pg (ref 27.0–33.0)
MCHC: 32.9 g/dL (ref 32.0–36.0)
MCV: 93.2 fL (ref 80.0–100.0)
MONO ABS: 427 {cells}/uL (ref 200–950)
MPV: 11.2 fL (ref 7.5–12.5)
Monocytes Relative: 7 %
NEUTROS ABS: 3599 {cells}/uL (ref 1500–7800)
Neutrophils Relative %: 59 %
PLATELETS: 213 10*3/uL (ref 140–400)
RBC: 4.27 MIL/uL (ref 3.80–5.10)
RDW: 14.3 % (ref 11.0–15.0)
WBC: 6.1 10*3/uL (ref 3.8–10.8)

## 2015-09-08 LAB — BASIC METABOLIC PANEL
BUN: 12 mg/dL (ref 7–25)
CHLORIDE: 102 mmol/L (ref 98–110)
CO2: 27 mmol/L (ref 20–31)
Calcium: 9.8 mg/dL (ref 8.6–10.4)
Creat: 0.81 mg/dL (ref 0.50–1.05)
GLUCOSE: 86 mg/dL (ref 65–99)
POTASSIUM: 4.3 mmol/L (ref 3.5–5.3)
Sodium: 140 mmol/L (ref 135–146)

## 2015-09-13 ENCOUNTER — Ambulatory Visit (INDEPENDENT_AMBULATORY_CARE_PROVIDER_SITE_OTHER): Payer: Federal, State, Local not specified - PPO | Admitting: Internal Medicine

## 2015-09-13 ENCOUNTER — Encounter: Payer: Self-pay | Admitting: Internal Medicine

## 2015-09-13 VITALS — BP 118/70 | HR 52 | Temp 98.2°F | Resp 12 | Ht 66.0 in | Wt 197.1 lb

## 2015-09-13 DIAGNOSIS — E162 Hypoglycemia, unspecified: Secondary | ICD-10-CM | POA: Diagnosis not present

## 2015-09-13 LAB — POCT CBG (FASTING - GLUCOSE)-MANUAL ENTRY: Glucose Fasting, POC: 83 mg/dL (ref 70–99)

## 2015-09-13 NOTE — Progress Notes (Signed)
Pre visit review using our clinic review tool, if applicable. No additional management support is needed unless otherwise documented below in the visit note. 

## 2015-09-13 NOTE — Assessment & Plan Note (Signed)
It is unclear if she has true hypolgycemia or just relative low sugar after ingesting high sugar contents. She has been eating more high sugar foods to try to help her symptoms which could worsen her symptoms. Refer to nutrition for diet with long acting complex sugars and advised to eat small meals every 1-2 hours. Awaiting the results of insulin and sulfonylureas. Sugar at home normal during an episode.

## 2015-09-13 NOTE — Progress Notes (Signed)
   Subjective:    Patient ID: Nicole GarbeConnie Gervasi, female    DOB: 09/04/1960, 55 y.o.   MRN: [id number]  HPI The patient is a 55 YO female coming in for follow up of her low blood sugar. She was seen about 1 week ago in the clinic for having some low blood sugar symptoms at home. She had some symptoms in their office and ate some candy. Their check after that was 70s (she did not tell them so they could check before she ate the candy). She has fasted this morning. She typically does not eat breakfast and eats small lunch then large dinner. Gets some episodes in the last 2 weeks only. She has been eating breakfast since the last office visit and is still getting some feelings like this. She just got a sugar meter and checked during an episode and sugar was 127. She is not having any symptoms right now.   Review of Systems  Constitutional: Negative.   Respiratory: Negative for cough, chest tightness, shortness of breath and wheezing.   Cardiovascular: Negative for chest pain, palpitations and leg swelling.  Gastrointestinal: Negative for abdominal distention, abdominal pain, constipation and diarrhea.  Musculoskeletal: Positive for arthralgias. Negative for back pain and gait problem.  Skin: Negative.   Neurological: Positive for dizziness and light-headedness. Negative for syncope and weakness.  Psychiatric/Behavioral: Negative for agitation, behavioral problems, confusion, decreased concentration, dysphoric mood, hallucinations, self-injury, sleep disturbance and suicidal ideas. The patient is nervous/anxious. The patient is not hyperactive.       Objective:   Physical Exam  Constitutional: She is oriented to person, place, and time. She appears well-developed and well-nourished.  Overweight  HENT:  Head: Normocephalic and atraumatic.  Eyes: EOM are normal.  Neck: Normal range of motion.  Cardiovascular: Normal rate and regular rhythm.   Pulmonary/Chest: Effort normal and breath sounds normal. No  respiratory distress. She has no wheezes. She has no rales.  Abdominal: Soft. Bowel sounds are normal. She exhibits no distension. There is no tenderness. There is no rebound.  Musculoskeletal: She exhibits no edema.  Neurological: She is alert and oriented to person, place, and time.  Skin: Skin is warm and dry.   Vitals:   09/13/15 0851  BP: 118/70  Pulse: (!) 52  Resp: 12  Temp: 98.2 F (36.8 C)  TempSrc: Oral  SpO2: 97%  Weight: 197 lb 1.9 oz (89.4 kg)  Height: 5\' 6"  (1.676 m)      Assessment & Plan:

## 2015-09-13 NOTE — Patient Instructions (Signed)
We will get you in with the nutritionist and follow up on the labs from Friday.   Work on eating small meals (even a handful of food) about every 1-2 hours.

## 2015-09-17 ENCOUNTER — Encounter: Payer: Self-pay | Admitting: Skilled Nursing Facility1

## 2015-09-17 ENCOUNTER — Encounter: Payer: Federal, State, Local not specified - PPO | Attending: Internal Medicine | Admitting: Skilled Nursing Facility1

## 2015-09-17 DIAGNOSIS — E162 Hypoglycemia, unspecified: Secondary | ICD-10-CM

## 2015-09-17 DIAGNOSIS — Z713 Dietary counseling and surveillance: Secondary | ICD-10-CM | POA: Diagnosis not present

## 2015-09-17 NOTE — Patient Instructions (Addendum)
-  When you are feeling dizzy check your sugar-when you wake up in the middle of the night check your sugar -Check either every day or a few times a week: before you eat anything in the morning and sometimes 2 hours after your biggest meal of the day be sure to write your numbers down --Always bring your meter with you everywhere you go -Always Properly dispose of your needles:  -Discard in a hard plastic/metal container with a lid (something the needle can't puncture)  -Write: Do Not Recycle on the outside of the container  -Example: A laundry detergent bottle -Never use the same needle more than once -A meal: carbohydrates, protein, vegetable -A snack: A Fruit OR Vegetable AND Protein  -Try to be more active -Keep working on fitting in other beverages   -Eat three meals a day and some snacks in between of you have already had your fruit and protein and you are still hungry but not ready for a meal a vegetable option would be good -speed clean for more intense activity

## 2015-09-17 NOTE — Progress Notes (Signed)
Medical Nutrition Therapy:  Appt start time: 11:00 end time:  12:00.   Assessment:  Primary concerns today: referred for hypoglycemia. Pt states she quit smoking 4 years ago and since then has gained wt with her usual wt being 150-160 pounds. Pt states she cannot lose wt. Pt states she usually has a low heart rate and blood pressure. Pt states she has always had low blood pressure but has recently had hypoglycemia which is new. Pts symptoms: close to passing out and dizzy which is corrected with butterscotch candy. Pt states her hysterectomy was done 2010 and then quit smoking 2013. Pts diet hx: atkins, wt watchers. Pt states she works in the yard every week. Pt states her energy level is low. Pt states she has started snoring at night. Pt states she wakes up in the middle of the night to urinate and when she gets up she is very dizzy and has to use the wall to stabilize herself. Pt states she got stung by a hoard of wasps 3 weeks ago which is also when she started having possible hypoglycemia symptoms. Pt states she ONLY drinks coffee and drinks 3 pots of regular a day. Pt states she has not felt thirst. Pts last A1C(06/2014) 5.1 Pts glucose reading: 83 Insulin lad has not been completed yet. Pts Sodium lab is WNL.  Preferred Learning Style:   No preference indicated   Learning Readiness:   Contemplating MEDICATIONS: See List   DIETARY INTAKE:  Usual eating pattern includes 2 meals and 3 snacks per day.  Everyday foods include none identified.  Avoided foods include none identified.    24-hr recall:  B ( AM): none Snk ( AM): fruit----ham and cheese L ( PM): Englisch muffin----scrambled egg Snk ( PM): fruit D ( PM): meat, salad, vegetables-----hamburger Snk ( PM): fruit----cupcake Beverages: coffee with unsweet almond milk and splenda  Usual physical activity: ADL's  Estimated energy needs: 1600 calories 180 g carbohydrates 120 g protein 44 g fat  Progress Towards Goal(s):   In progress.   Nutritional Diagnosis:  [place]-3.3 Overweight/obesity As related to physical inactivity and overconsumption.  As evidenced by pt report, 24 hr recall, and BMI 31.7.    Intervention:  Nutrition counseling for possible hypoglycemia. Dietitian educated the pt on hypoglycemia, meal frequency, balanced meals, and the importance of proper physical activity. Goals: -When you are feeling dizzy check your sugar-when you wake up in the middle of the night check your sugar -Check either every day or a few times a week: before you eat anything in the morning and sometimes 2 hours after your biggest meal of the day be sure to write your numbers down --Always bring your meter with you everywhere you go -Always Properly dispose of your needles:  -Discard in a hard plastic/metal container with a lid (something the needle can't puncture)  -Write: Do Not Recycle on the outside of the container  -Example: A laundry detergent bottle -Never use the same needle more than once -A meal: carbohydrates, protein, vegetable -A snack: A Fruit OR Vegetable AND Protein  -Try to be more active -Keep working on fitting in other beverages   -Eat three meals a day and some snacks in between of you have already had your fruit and protein and you are still hungry but not ready for a meal a vegetable option would be good -speed clean for more intense activity   Teaching Method Utilized:  Visual Auditory Hands on  Handouts given during visit include:  MyPlate  Barriers to learning/adherence to lifestyle change: none identified  Demonstrated degree of understanding via:  Teach Back   Monitoring/Evaluation:  Dietary intake, exercise, Glycemic labs, and body weight prn.

## 2015-09-20 LAB — SULFONYLUREA HYPOGLYCEMICS PANEL, SERUM

## 2016-02-04 ENCOUNTER — Encounter: Payer: Self-pay | Admitting: Internal Medicine

## 2016-02-04 ENCOUNTER — Ambulatory Visit (INDEPENDENT_AMBULATORY_CARE_PROVIDER_SITE_OTHER): Payer: Federal, State, Local not specified - PPO | Admitting: Internal Medicine

## 2016-02-04 ENCOUNTER — Other Ambulatory Visit (INDEPENDENT_AMBULATORY_CARE_PROVIDER_SITE_OTHER): Payer: Federal, State, Local not specified - PPO

## 2016-02-04 VITALS — BP 130/80 | HR 53 | Temp 98.1°F | Resp 14 | Ht 66.0 in | Wt 212.0 lb

## 2016-02-04 DIAGNOSIS — R5383 Other fatigue: Secondary | ICD-10-CM | POA: Diagnosis not present

## 2016-02-04 DIAGNOSIS — F418 Other specified anxiety disorders: Secondary | ICD-10-CM

## 2016-02-04 DIAGNOSIS — F419 Anxiety disorder, unspecified: Secondary | ICD-10-CM

## 2016-02-04 DIAGNOSIS — F329 Major depressive disorder, single episode, unspecified: Secondary | ICD-10-CM

## 2016-02-04 DIAGNOSIS — G8918 Other acute postprocedural pain: Secondary | ICD-10-CM | POA: Diagnosis not present

## 2016-02-04 LAB — CBC
HCT: 39.4 % (ref 36.0–46.0)
Hemoglobin: 13.6 g/dL (ref 12.0–15.0)
MCHC: 34.6 g/dL (ref 30.0–36.0)
MCV: 89.8 fl (ref 78.0–100.0)
PLATELETS: 221 10*3/uL (ref 150.0–400.0)
RBC: 4.38 Mil/uL (ref 3.87–5.11)
RDW: 13.8 % (ref 11.5–15.5)
WBC: 4.7 10*3/uL (ref 4.0–10.5)

## 2016-02-04 LAB — COMPREHENSIVE METABOLIC PANEL
ALT: 24 U/L (ref 0–35)
AST: 27 U/L (ref 0–37)
Albumin: 4.4 g/dL (ref 3.5–5.2)
Alkaline Phosphatase: 59 U/L (ref 39–117)
BUN: 16 mg/dL (ref 6–23)
CALCIUM: 9.8 mg/dL (ref 8.4–10.5)
CO2: 29 mEq/L (ref 19–32)
CREATININE: 0.79 mg/dL (ref 0.40–1.20)
Chloride: 102 mEq/L (ref 96–112)
GFR: 80.04 mL/min (ref >60.00)
GLUCOSE: 81 mg/dL (ref 70–99)
Potassium: 4.1 mEq/L (ref 3.5–5.1)
Sodium: 139 mEq/L (ref 135–145)
Total Bilirubin: 0.7 mg/dL (ref 0.2–1.2)
Total Protein: 7 g/dL (ref 6.0–8.3)

## 2016-02-04 LAB — TSH: TSH: 2.33 u[IU]/mL (ref 0.35–4.50)

## 2016-02-04 LAB — T4, FREE: FREE T4: 0.93 ng/dL (ref 0.60–1.60)

## 2016-02-04 LAB — VITAMIN D 25 HYDROXY (VIT D DEFICIENCY, FRACTURES): VITD: 42.5 ng/mL (ref 30.00–100.00)

## 2016-02-04 LAB — VITAMIN B12

## 2016-02-04 MED ORDER — DULOXETINE HCL 30 MG PO CPEP: 30.0000 mg | ORAL_CAPSULE | Freq: Every day | ORAL | 1 refills | Status: DC

## 2016-02-04 NOTE — Patient Instructions (Signed)
We will get you in with the general surgeon and check the labs today.  We have sent in cymbalta today which you can start taking. Give it 2-3 weeks and then call us back or send us a mychart message if it is working.   We are starting you out on the starter dose so if it helps some we can increase to the full dose then.

## 2016-02-04 NOTE — Progress Notes (Signed)
   Subjective:    Patient ID: Augusto GarbeConnie Beckom, female    DOB: 05/19/1960, 56 y.o.   MRN: [id number]  HPI  The patient is a 56 YO female coming in for several concerns. She is having non-stop pain in the left groin/hip. She has been struggling with workman's comp over the hip and had 2-3 surgeries on it (finally full replacement) and many prior surgeries including several for hysterectomy (in stages she says). Every time she had a surgery she was told that she had a lot of scar tissue and formed a lot of scar tissue. She is concerned that she has scar tissue in the pelvic region which is causing pain from a nerve. She is in pain all the time. She has not gotten any answers from the workman's comp evaluation and is looking to get help even if not covered by [employer]. She is wanting to see a general surgeon about the scar tissue formation. They have tried her on some things for pain off and on but they just tell her she needs to go to pain management.  She is also concerned about her mood. She is feeling very down and apathetic since struggling with the daily pain for several years. She does not smoke, drink, and feels she has no way to unwind with stress except eating. This has caused her to gain some weight which she feels is worsening her problems. Denies SI/HI. Some crying although not at much as initially. She does have some support at home but not a large support network.   Review of Systems  Constitutional: Positive for activity change, appetite change and fatigue. Negative for chills, fever and unexpected weight change.  Respiratory: Negative.   Cardiovascular: Negative.   Gastrointestinal: Negative.   Musculoskeletal: Positive for arthralgias, gait problem and myalgias. Negative for neck pain and neck stiffness.  Skin: Negative.   Neurological: Negative for dizziness, weakness, light-headedness, numbness and headaches.  Psychiatric/Behavioral: Positive for decreased concentration, dysphoric  mood and sleep disturbance. Negative for agitation, behavioral problems, confusion, self-injury and suicidal ideas. The patient is nervous/anxious.       Objective:   Physical Exam  Constitutional: She is oriented to person, place, and time. She appears well-developed and well-nourished.  HENT:  Head: Normocephalic and atraumatic.  Eyes: EOM are normal.  Neck: Normal range of motion.  Cardiovascular: Normal rate and regular rhythm.   Pulmonary/Chest: Effort normal and breath sounds normal.  Abdominal: Soft.  Musculoskeletal: She exhibits no edema.  Neurological: She is alert and oriented to person, place, and time. Coordination abnormal.  Slow gait and stiff left leg  Skin: Skin is warm and dry.  Psychiatric:  Flat affect but got distraught when talking about workman's comp treatment of her.    Vitals:   02/04/16 0932  BP: 130/80  Pulse: (!) 53  Resp: 14  Temp: 98.1 F (36.7 C)  TempSrc: Oral  SpO2: 98%  Weight: 212 lb (96.2 kg)  Height: 5\' 6"  (1.676 m)      Assessment & Plan:

## 2016-02-04 NOTE — Assessment & Plan Note (Signed)
She does believe that she has some scar tissue in the pelvic and hip area and the orthopedic surgeon has not been interested in helping her. She wants to see general surgeon to see if they can evaluate. Referral done today. Rx for cymbalta to see if this can help with her pain.

## 2016-02-04 NOTE — Assessment & Plan Note (Signed)
Suspect some of this is related to her chronic pain and depression. No labs in some time. Checking thyroid, CMP, CBC, B12, vitamin D.

## 2016-02-04 NOTE — Progress Notes (Signed)
Pre visit review using our clinic review tool, if applicable. No additional management support is needed unless otherwise documented below in the visit note. 

## 2016-02-04 NOTE — Assessment & Plan Note (Signed)
Not taking anything for this. Rx cymbalta to help with the depression as well as possibly some of the pelvic pain. No SI/HI and she is advised that counseling could be helpful as well but she does not feel able to do that right now.

## 2016-02-06 ENCOUNTER — Encounter: Payer: Self-pay | Admitting: Internal Medicine

## 2016-02-06 LAB — HM MAMMOGRAPHY

## 2016-02-06 NOTE — Progress Notes (Unsigned)
Results entered and sent to scan  

## 2016-02-12 ENCOUNTER — Telehealth: Payer: Self-pay | Admitting: Internal Medicine

## 2016-02-12 NOTE — Telephone Encounter (Signed)
You can call patient and let her know. I cannot change their decision.

## 2016-02-12 NOTE — Telephone Encounter (Signed)
[facility]. They state she needs to follow up with the original surgeon.  Please advise.

## 2016-02-12 NOTE — Telephone Encounter (Signed)
[facility] states pt will need to go back to original surgeon. If she receives all office notes from original surgeon the doctors at [facility] will review them for consideration to see her.  Pt aware Closing referral for now.

## 2016-03-10 ENCOUNTER — Encounter: Payer: Self-pay | Admitting: Family

## 2016-03-10 ENCOUNTER — Ambulatory Visit (INDEPENDENT_AMBULATORY_CARE_PROVIDER_SITE_OTHER): Payer: Federal, State, Local not specified - PPO | Admitting: Family

## 2016-03-10 VITALS — BP 118/76 | HR 64 | Temp 98.3°F | Resp 18 | Ht 66.0 in | Wt 208.0 lb

## 2016-03-10 DIAGNOSIS — H6981 Other specified disorders of Eustachian tube, right ear: Secondary | ICD-10-CM | POA: Diagnosis not present

## 2016-03-10 DIAGNOSIS — J329 Chronic sinusitis, unspecified: Secondary | ICD-10-CM | POA: Insufficient documentation

## 2016-03-10 DIAGNOSIS — J0140 Acute pansinusitis, unspecified: Secondary | ICD-10-CM

## 2016-03-10 DIAGNOSIS — H6991 Unspecified Eustachian tube disorder, right ear: Secondary | ICD-10-CM | POA: Insufficient documentation

## 2016-03-10 MED ORDER — METHYLPREDNISOLONE ACETATE 80 MG/ML IJ SUSP
80.0000 mg | Freq: Once | INTRAMUSCULAR | Status: AC
  Administered 2016-03-10: 80 mg via INTRAMUSCULAR

## 2016-03-10 MED ORDER — METHYLPREDNISOLONE 4 MG PO TBPK: ORAL_TABLET | ORAL | 0 refills | Status: DC

## 2016-03-10 MED ORDER — AMOXICILLIN-POT CLAVULANATE 875-125 MG PO TABS: 1.0000 | ORAL_TABLET | Freq: Two times a day (BID) | ORAL | 0 refills | Status: DC

## 2016-03-10 NOTE — Progress Notes (Signed)
Subjective:    Patient ID: Nicole Wilcox, female    DOB: 04/23/1960, 56 y.o.   MRN: [id number]  Chief Complaint  Patient presents with  . Nasal Congestion    x2 weeks, nasal congestion, green mucus, ears clogged    HPI:  Nicole Wilcox is a 56 y.o. female who  has a past medical history of Arthritis. and presents today for an acute office visit.   This is a new problem. Associated symptoms of nasal congestion, sinus pressure, ears feeling full, and green mucus production a been going on for approximately 2 weeks. Denies fevers. Modifying factors include cold/flu medication and benadryl which did help a little with symptoms. Over the course of the 2 weeks the symptoms got better initially after onset and then got worse. No recent antibiotics.                                                                                                                                                                                                                                                                             No Known Allergies    Outpatient Medications Prior to Visit  Medication Sig Dispense Refill  . b complex vitamins tablet Take 1 tablet by mouth daily.    . cholecalciferol (VITAMIN D) 400 UNITS TABS Take by mouth daily.    . Chromium Picolinate 500 MCG TABS Take by mouth daily.    . DULoxetine (CYMBALTA) 30 MG capsule Take 1 capsule (30 mg total) by mouth daily. 30 capsule 1  . Multiple Vitamin (MULTIVITAMIN) tablet Take 1 tablet by mouth daily.    . Multiple Vitamin (THERA) TABS Take by mouth.    . Nutritional Supplements (MENOPAUSE FORMULA PO) Take 450 mg by mouth.    . Potassium 99 MG TABS Take by mouth.     No facility-administered medications prior to visit.      Review of Systems  Constitutional: Negative for chills and fever.  HENT: Positive for congestion, ear pain and sinus pressure. Negative for ear discharge, sinus pain and sore throat.   Respiratory: Positive for  shortness of breath. Negative for chest tightness and wheezing.   Cardiovascular: Negative for chest pain.  Neurological: Positive for  headaches.      Objective:    BP 118/76 (BP Location: Left Arm, Patient Position: Sitting, Cuff Size: Large)   Pulse 64   Temp 98.3 F (36.8 C) (Oral)   Resp 18   Ht 5\' 6"  (1.676 m)   Wt 208 lb (94.3 kg)   SpO2 95%   BMI 33.57 kg/m  Nursing note and vital signs reviewed.  Physical Exam  Constitutional: She is oriented to person, place, and time. She appears well-developed and well-nourished.  HENT:  Right Ear: Hearing, tympanic membrane, external ear and ear canal normal.  Left Ear: Hearing, tympanic membrane, external ear and ear canal normal.  Nose: Right sinus exhibits maxillary sinus tenderness and frontal sinus tenderness. Left sinus exhibits maxillary sinus tenderness and frontal sinus tenderness.  Mouth/Throat: Uvula is midline, oropharynx is clear and moist and mucous membranes are normal.  Neck: Neck supple.  Cardiovascular: Normal rate, regular rhythm, normal heart sounds and intact distal pulses.   Pulmonary/Chest: Effort normal and breath sounds normal.  Neurological: She is alert and oriented to person, place, and time.  Skin: Skin is warm and dry.       Assessment & Plan:   Problem List Items Addressed This Visit      Respiratory   Sinusitis - Primary    Symptoms and exam consistent with acute sinusitis most likely bacterial given initial improvement and worsening. Start Augmentin. Continue over-the-counter medications as needed for symptom relief and supportive care. Follow-up if symptoms worsen or do not improve.      Relevant Medications   methylPREDNISolone (MEDROL) 4 MG TBPK tablet   amoxicillin-clavulanate (AUGMENTIN) 875-125 MG tablet     Nervous and Auditory   Dysfunction of right eustachian tube    Symptoms and exam consistent with dysfunction of the right eustachian tube secondary to sinusitis. In office  injection of Depo-Medrol provided with Medrol Dosepak to follow. Recommend decongestion with Aleve-D as well as Flonase. Follow-up if symptoms worsen or do not improve.      Relevant Medications   methylPREDNISolone (MEDROL) 4 MG TBPK tablet   amoxicillin-clavulanate (AUGMENTIN) 875-125 MG tablet       I am having [name] start on methylPREDNISolone and amoxicillin-clavulanate. I am also having her maintain her multivitamin, cholecalciferol, Chromium Picolinate, Potassium, b complex vitamins, THERA, Nutritional Supplements (MENOPAUSE FORMULA PO), and DULoxetine.   Meds ordered this encounter  Medications  . methylPREDNISolone (MEDROL) 4 MG TBPK tablet    Sig: Take according to package instructions.    Dispense:  21 tablet    Refill:  0    Order Specific Question:   Supervising Provider    Answer:   Hillard Danker A [[id number]]  . amoxicillin-clavulanate (AUGMENTIN) 875-125 MG tablet    Sig: Take 1 tablet by mouth 2 (two) times daily.    Dispense:  14 tablet    Refill:  0    Order Specific Question:   Supervising Provider    Answer:   Hillard Danker A [[id number]]     Follow-up: Return if symptoms worsen or fail to improve.  Jeanine Luz, FNP

## 2016-03-10 NOTE — Patient Instructions (Signed)
Thank you for choosing [facility].  SUMMARY AND INSTRUCTIONS:  Recommend Aleve-D (behind the counter) and flonase to help with congestion.   Medication:  Your prescription(s) have been submitted to your pharmacy or been printed and provided for you. Please take as directed and contact our office if you believe you are having problem(s) with the medication(s) or have any questions.  Follow up:  If your symptoms worsen or fail to improve, please contact our office for further instruction, or in case of emergency go directly to the emergency room at the closest medical facility.    General Recommendations:    Please drink plenty of fluids.  Get plenty of rest   Sleep in humidified air  Use saline nasal sprays  Netti pot   OTC Medications:  Decongestants - helps relieve congestion   Flonase (generic fluticasone) or Nasacort (generic triamcinolone) - please make sure to use the "cross-over" technique at a 45 degree angle towards the opposite eye as opposed to straight up the nasal passageway.   Sudafed (generic pseudoephedrine - Note this is the one that is available behind the pharmacy counter); Products with phenylephrine (-PE) may also be used but is often not as effective as pseudoephedrine.   If you have HIGH BLOOD PRESSURE - Coricidin HBP; AVOID any product that is -D as this contains pseudoephedrine which may increase your blood pressure.  Afrin (oxymetazoline) every 6-8 hours for up to 3 days.   Allergies - helps relieve runny nose, itchy eyes and sneezing   Claritin (generic loratidine), Allegra (fexofenidine), or Zyrtec (generic cyrterizine) for runny nose. These medications should not cause drowsiness.  Note - Benadryl (generic diphenhydramine) may be used however may cause drowsiness  Cough -   Delsym or Robitussin (generic dextromethorphan)  Expectorants - helps loosen mucus to ease removal   Mucinex (generic guaifenesin) as directed on the  package.  Headaches / General Aches   Tylenol (generic acetaminophen) - DO NOT EXCEED 3 grams (3,000 mg) in a 24 hour time period  Advil/Motrin (generic ibuprofen)   Sore Throat -   Salt water gargle   Chloraseptic (generic benzocaine) spray or lozenges / Sucrets (generic dyclonine)    Sinusitis Sinusitis is redness, soreness, and inflammation of the paranasal sinuses. Paranasal sinuses are air pockets within the bones of your face (beneath the eyes, the middle of the forehead, or above the eyes). In healthy paranasal sinuses, mucus is able to drain out, and air is able to circulate through them by way of your nose. However, when your paranasal sinuses are inflamed, mucus and air can become trapped. This can allow bacteria and other germs to grow and cause infection. Sinusitis can develop quickly and last only a short time (acute) or continue over a long period (chronic). Sinusitis that lasts for more than 12 weeks is considered chronic.  CAUSES  Causes of sinusitis include:  Allergies.  Structural abnormalities, such as displacement of the cartilage that separates your nostrils (deviated septum), which can decrease the air flow through your nose and sinuses and affect sinus drainage.  Functional abnormalities, such as when the small hairs (cilia) that line your sinuses and help remove mucus do not work properly or are not present. SIGNS AND SYMPTOMS  Symptoms of acute and chronic sinusitis are the same. The primary symptoms are pain and pressure around the affected sinuses. Other symptoms include:  Upper toothache.  Earache.  Headache.  Bad breath.  Decreased sense of smell and taste.  A cough, which worsens  when you are lying flat.  Fatigue.  Fever.  Thick drainage from your nose, which often is green and may contain pus (purulent).  Swelling and warmth over the affected sinuses. DIAGNOSIS  Your health care provider will perform a physical exam. During the exam,  your health care provider may:  Look in your nose for signs of abnormal growths in your nostrils (nasal polyps).  Tap over the affected sinus to check for signs of infection.  View the inside of your sinuses (endoscopy) using an imaging device that has a light attached (endoscope). If your health care provider suspects that you have chronic sinusitis, one or more of the following tests may be recommended:  Allergy tests.  Nasal culture. A sample of mucus is taken from your nose, sent to a lab, and screened for bacteria.  Nasal cytology. A sample of mucus is taken from your nose and examined by your health care provider to determine if your sinusitis is related to an allergy. TREATMENT  Most cases of acute sinusitis are related to a viral infection and will resolve on their own within 10 days. Sometimes medicines are prescribed to help relieve symptoms (pain medicine, decongestants, nasal steroid sprays, or saline sprays).  However, for sinusitis related to a bacterial infection, your health care provider will prescribe antibiotic medicines. These are medicines that will help kill the bacteria causing the infection.  Rarely, sinusitis is caused by a fungal infection. In theses cases, your health care provider will prescribe antifungal medicine. For some cases of chronic sinusitis, surgery is needed. Generally, these are cases in which sinusitis recurs more than 3 times per year, despite other treatments. HOME CARE INSTRUCTIONS   Drink plenty of water. Water helps thin the mucus so your sinuses can drain more easily.  Use a humidifier.  Inhale steam 3 to 4 times a day (for example, sit in the bathroom with the shower running).  Apply a warm, moist washcloth to your face 3 to 4 times a day, or as directed by your health care provider.  Use saline nasal sprays to help moisten and clean your sinuses.  Take medicines only as directed by your health care provider.  If you were prescribed  either an antibiotic or antifungal medicine, finish it all even if you start to feel better. SEEK IMMEDIATE MEDICAL CARE IF:  You have increasing pain or severe headaches.  You have nausea, vomiting, or drowsiness.  You have swelling around your face.  You have vision problems.  You have a stiff neck.  You have difficulty breathing. MAKE SURE YOU:   Understand these instructions.  Will watch your condition.  Will get help right away if you are not doing well or get worse. Document Released: 01/13/2005 Document Revised: 05/30/2013 Document Reviewed: 01/28/2011 [facility] Patient Information 2015 [employer], [place]. This information is not intended to replace advice given to you by your health care provider. Make sure you discuss any questions you have with your health care provider.

## 2016-03-10 NOTE — Assessment & Plan Note (Signed)
Symptoms and exam consistent with dysfunction of the right eustachian tube secondary to sinusitis. In office injection of Depo-Medrol provided with Medrol Dosepak to follow. Recommend decongestion with Aleve-D as well as Flonase. Follow-up if symptoms worsen or do not improve.

## 2016-03-10 NOTE — Assessment & Plan Note (Signed)
Symptoms and exam consistent with acute sinusitis most likely bacterial given initial improvement and worsening. Start Augmentin. Continue over-the-counter medications as needed for symptom relief and supportive care. Follow-up if symptoms worsen or do not improve.

## 2016-03-24 ENCOUNTER — Telehealth: Payer: Self-pay | Admitting: Internal Medicine

## 2016-03-24 ENCOUNTER — Other Ambulatory Visit: Payer: Self-pay | Admitting: Internal Medicine

## 2016-03-24 MED ORDER — DULOXETINE HCL 60 MG PO CPEP: 60.0000 mg | ORAL_CAPSULE | Freq: Every day | ORAL | 6 refills | Status: DC

## 2016-03-24 NOTE — Telephone Encounter (Signed)
Pt called in and said that she thinks she want a little higher dose of the Cymbalta and call in the new doseage

## 2016-03-24 NOTE — Telephone Encounter (Signed)
New rx sent in

## 2016-03-24 NOTE — Telephone Encounter (Signed)
Pleas advise.

## 2016-08-11 ENCOUNTER — Telehealth: Payer: Self-pay

## 2016-08-11 NOTE — Telephone Encounter (Signed)
Recently got papers from an attorney called Gwinda PasseUliase & [employer]a [employer]se[employer] concerning patients disability.  From my understanding they are wanting our office to provide supporting information concerning patient so they do not reduce her disability.  In the forms it has report of patient seeing another physician that deals with seeing disability patients and has deemed her capable of some kind of work.  After giving form to Dr.Jones and him review them and I have also looked through chart, we have no supporting records of patients disability, no FMLA, no visits concerning disability.  [employer]ed Attorney office and stated to them that we don't really have anything to give them, that we don't really know about her disability or have any supporting documents for it. Stated that she last saw Okey DupreCrawford in January for something other than disability.  She stated that she will relay the info to the attorney and might call back in regards.

## 2016-09-23 ENCOUNTER — Other Ambulatory Visit: Payer: Self-pay

## 2016-09-23 ENCOUNTER — Other Ambulatory Visit: Payer: Self-pay | Admitting: Internal Medicine

## 2016-09-23 ENCOUNTER — Telehealth: Payer: Self-pay | Admitting: Internal Medicine

## 2016-09-23 MED ORDER — DULOXETINE HCL 30 MG PO CPEP: 30.0000 mg | ORAL_CAPSULE | Freq: Every day | ORAL | 0 refills | Status: DC

## 2016-09-23 MED ORDER — DULOXETINE HCL 30 MG PO CPEP
30.0000 mg | ORAL_CAPSULE | Freq: Every day | ORAL | 0 refills | Status: DC
Start: 2016-09-23 — End: 2016-09-23

## 2016-09-23 NOTE — Telephone Encounter (Signed)
Ok to send 30tabs of cymbalta 30mg , no refills. She needs to make OV with Dr. Okey Dupre before this prescription runs out

## 2016-09-23 NOTE — Telephone Encounter (Signed)
Pt called stating she would like to go on the lower dos of DULoxetine (CYMBALTA) 60 MG capsule She was on 30mg  and would like to to go back to that. She states she has been overheating and her BP has been up to 158/82,  For the past week she has taken the 60mg  every other day and her BP is down to 123/80 Please advise  Does she need an appt

## 2016-09-23 NOTE — Telephone Encounter (Signed)
rx sent needs an office visit for further refills

## 2016-09-23 NOTE — Telephone Encounter (Signed)
Please advise per Dr. crawfords absence. thanks 

## 2016-10-23 ENCOUNTER — Other Ambulatory Visit: Payer: Self-pay | Admitting: Internal Medicine

## 2016-10-23 ENCOUNTER — Telehealth: Payer: Self-pay | Admitting: Internal Medicine

## 2016-10-23 MED ORDER — DULOXETINE HCL 30 MG PO CPEP
30.0000 mg | ORAL_CAPSULE | Freq: Every day | ORAL | 0 refills | Status: DC
Start: 2016-10-23 — End: 2016-10-31

## 2016-10-23 NOTE — Telephone Encounter (Signed)
Per office policy sent 30 day to local pharmacy until appt.../lmb  

## 2016-10-23 NOTE — Telephone Encounter (Signed)
Pt called for a refill of her DULoxetine (CYMBALTA) 30 MG capsule  Please send to [facility] on [address]  Appt made for 10/5 w/ Crawford Please advise and call back

## 2016-10-31 ENCOUNTER — Encounter: Payer: Self-pay | Admitting: Internal Medicine

## 2016-10-31 ENCOUNTER — Ambulatory Visit (INDEPENDENT_AMBULATORY_CARE_PROVIDER_SITE_OTHER): Payer: Federal, State, Local not specified - PPO | Admitting: Internal Medicine

## 2016-10-31 DIAGNOSIS — M25552 Pain in left hip: Secondary | ICD-10-CM | POA: Diagnosis not present

## 2016-10-31 DIAGNOSIS — Z0000 Encounter for general adult medical examination without abnormal findings: Secondary | ICD-10-CM

## 2016-10-31 DIAGNOSIS — F419 Anxiety disorder, unspecified: Secondary | ICD-10-CM

## 2016-10-31 DIAGNOSIS — F329 Major depressive disorder, single episode, unspecified: Secondary | ICD-10-CM | POA: Diagnosis not present

## 2016-10-31 DIAGNOSIS — G8929 Other chronic pain: Secondary | ICD-10-CM

## 2016-10-31 MED ORDER — DULOXETINE HCL 30 MG PO CPEP: 30.0000 mg | ORAL_CAPSULE | Freq: Every day | ORAL | 3 refills | Status: DC

## 2016-10-31 NOTE — Assessment & Plan Note (Signed)
Doing well on cymbalta 30 mg daily, some increased stressors recently but she is coping well. No change in therapy today.

## 2016-10-31 NOTE — Patient Instructions (Signed)
We have sent in the refills.  

## 2016-10-31 NOTE — Assessment & Plan Note (Signed)
Recently off disability and is working with her lawyer to reinstate.

## 2016-10-31 NOTE — Assessment & Plan Note (Signed)
Colonoscopy and mammogram up to date. Flu shot up to date. Tetanus up to date. Counseled about sun safety and mole surveillance. Given screening recommendations.

## 2016-10-31 NOTE — Progress Notes (Signed)
   Subjective:    Patient ID: Nicole Wilcox, female    DOB: 1960-06-03, 56 y.o.   MRN: [id number]  HPI The patient is a 56 YO female coming in for wellness. No new concerns.   PMH, [facility], social history reviewed and updated.   Review of Systems  Constitutional: Negative.   HENT: Negative.   Eyes: Negative.   Respiratory: Negative for cough, chest tightness and shortness of breath.   Cardiovascular: Negative for chest pain, palpitations and leg swelling.  Gastrointestinal: Negative for abdominal distention, abdominal pain, constipation, diarrhea, nausea and vomiting.  Musculoskeletal: Negative.   Skin: Negative.   Neurological: Negative.   Psychiatric/Behavioral: Negative.       Objective:   Physical Exam  Constitutional: She is oriented to person, place, and time. She appears well-developed and well-nourished.  HENT:  Head: Normocephalic and atraumatic.  Eyes: EOM are normal.  Neck: Normal range of motion.  Cardiovascular: Normal rate and regular rhythm.   Pulmonary/Chest: Effort normal and breath sounds normal. No respiratory distress. She has no wheezes. She has no rales.  Abdominal: Soft. Bowel sounds are normal. She exhibits no distension. There is no tenderness. There is no rebound.  Musculoskeletal: She exhibits no edema.  Neurological: She is alert and oriented to person, place, and time. Coordination normal.  Skin: Skin is warm and dry.  Psychiatric: She has a normal mood and affect.   Vitals:   10/31/16 1031  BP: 130/80  Pulse: (!) 52  Temp: 97.7 F (36.5 C)  TempSrc: Oral  SpO2: 99%  Weight: 205 lb (93 kg)  Height:  (1.676 m)      Assessment & Plan:

## 2016-11-23 ENCOUNTER — Other Ambulatory Visit: Payer: Self-pay | Admitting: Internal Medicine

## 2017-02-24 LAB — HM MAMMOGRAPHY

## 2017-02-25 ENCOUNTER — Encounter: Payer: Self-pay | Admitting: Internal Medicine

## 2017-02-25 NOTE — Progress Notes (Signed)
Abstracted and sent to scan  

## 2017-03-05 ENCOUNTER — Encounter: Payer: Self-pay | Admitting: Internal Medicine

## 2017-03-05 ENCOUNTER — Other Ambulatory Visit (INDEPENDENT_AMBULATORY_CARE_PROVIDER_SITE_OTHER): Payer: Federal, State, Local not specified - PPO

## 2017-03-05 ENCOUNTER — Ambulatory Visit (INDEPENDENT_AMBULATORY_CARE_PROVIDER_SITE_OTHER): Payer: Federal, State, Local not specified - PPO | Admitting: Internal Medicine

## 2017-03-05 VITALS — BP 120/86 | HR 67 | Temp 98.0°F | Ht 66.0 in | Wt 216.0 lb

## 2017-03-05 DIAGNOSIS — F419 Anxiety disorder, unspecified: Secondary | ICD-10-CM | POA: Diagnosis not present

## 2017-03-05 DIAGNOSIS — F329 Major depressive disorder, single episode, unspecified: Secondary | ICD-10-CM

## 2017-03-05 DIAGNOSIS — E6609 Other obesity due to excess calories: Secondary | ICD-10-CM

## 2017-03-05 DIAGNOSIS — Z0000 Encounter for general adult medical examination without abnormal findings: Secondary | ICD-10-CM

## 2017-03-05 LAB — COMPREHENSIVE METABOLIC PANEL
ALBUMIN: 4.3 g/dL (ref 3.5–5.2)
ALK PHOS: 67 U/L (ref 39–117)
ALT: 21 U/L (ref 0–35)
AST: 25 U/L (ref 0–37)
BILIRUBIN TOTAL: 0.6 mg/dL (ref 0.2–1.2)
BUN: 17 mg/dL (ref 6–23)
CALCIUM: 9.3 mg/dL (ref 8.4–10.5)
CO2: 29 meq/L (ref 19–32)
CREATININE: 0.78 mg/dL (ref 0.40–1.20)
Chloride: 104 mEq/L (ref 96–112)
GFR: 80.91 mL/min (ref >60.00)
Glucose, Bld: 91 mg/dL (ref 70–99)
Potassium: 4.2 mEq/L (ref 3.5–5.1)
Sodium: 140 mEq/L (ref 135–145)
TOTAL PROTEIN: 7 g/dL (ref 6.0–8.3)

## 2017-03-05 LAB — LIPID PANEL
CHOL/HDL RATIO: 3
CHOLESTEROL: 213 mg/dL — AB (ref 0–200)
HDL: 80.7 mg/dL (ref >39.00)
LDL Cholesterol: 115 mg/dL — ABNORMAL HIGH (ref 0–99)
NonHDL: 132.56
Triglycerides: 86 mg/dL (ref 0.0–149.0)
VLDL: 17.2 mg/dL (ref 0.0–40.0)

## 2017-03-05 LAB — HEMOGLOBIN A1C: Hgb A1c MFr Bld: 5.2 % (ref 4.6–6.5)

## 2017-03-05 LAB — CBC
HCT: 39.8 % (ref 36.0–46.0)
HEMOGLOBIN: 13.4 g/dL (ref 12.0–15.0)
MCHC: 33.7 g/dL (ref 30.0–36.0)
MCV: 92.9 fl (ref 78.0–100.0)
PLATELETS: 234 10*3/uL (ref 150.0–400.0)
RBC: 4.28 Mil/uL (ref 3.87–5.11)
RDW: 13.2 % (ref 11.5–15.5)
WBC: 4.8 10*3/uL (ref 4.0–10.5)

## 2017-03-05 NOTE — Assessment & Plan Note (Signed)
Given names of weight loss medications and she will check on coverage.

## 2017-03-05 NOTE — Assessment & Plan Note (Signed)
Colonoscopy up to date, mammogram up to date. Pap smear not needed. Flu and tetanus up to date. Added to shingrix waiting list. Counseled about sun safety and mole surveillance. Given 10 year screening recommendations.

## 2017-03-05 NOTE — Assessment & Plan Note (Signed)
Taking cymbalta with better control but her weight has been a huge trigger in decreasing her self worth and confidence.

## 2017-03-05 NOTE — Patient Instructions (Signed)
We will check the labs today and call you back about the results.   The medicines for weight loss are: belviq (one a day pill) and saxenda (once a day shot) that would be okay for you to try.   Health Maintenance, Female Adopting a healthy lifestyle and getting preventive care can go a long way to promote health and wellness. Talk with your health care provider about what schedule of regular examinations is right for you. This is a good chance for you to check in with your provider about disease prevention and staying healthy. In between checkups, there are plenty of things you can do on your own. Experts have done a lot of research about which lifestyle changes and preventive measures are most likely to keep you healthy. Ask your health care provider for more information. Weight and diet Eat a healthy diet  Be sure to include plenty of vegetables, fruits, low-fat dairy products, and lean protein.  Do not eat a lot of foods high in solid fats, added sugars, or salt.  Get regular exercise. This is one of the most important things you can do for your health. ? Most adults should exercise for at least 150 minutes each week. The exercise should increase your heart rate and make you sweat (moderate-intensity exercise). ? Most adults should also do strengthening exercises at least twice a week. This is in addition to the moderate-intensity exercise.  Maintain a healthy weight  Body mass index (BMI) is a measurement that can be used to identify possible weight problems. It estimates body fat based on height and weight. Your health care provider can help determine your BMI and help you achieve or maintain a healthy weight.  For females 33 years of age and older: ? A BMI below 18.5 is considered underweight. ? A BMI of 18.5 to 24.9 is normal. ? A BMI of 25 to 29.9 is considered overweight. ? A BMI of 30 and above is considered obese.  Watch levels of cholesterol and blood lipids  You should  start having your blood tested for lipids and cholesterol at 57 years of age, then have this test every 5 years.  You may need to have your cholesterol levels checked more often if: ? Your lipid or cholesterol levels are high. ? You are older than 57 years of age. ? You are at high risk for heart disease.  Cancer screening Lung Cancer  Lung cancer screening is recommended for adults 2-77 years old who are at high risk for lung cancer because of a history of smoking.  A yearly low-dose CT scan of the lungs is recommended for people who: ? Currently smoke. ? Have quit within the past 15 years. ? Have at least a 30-pack-year history of smoking. A pack year is smoking an average of one pack of cigarettes a day for 1 year.  Yearly screening should continue until it has been 15 years since you quit.  Yearly screening should stop if you develop a health problem that would prevent you from having lung cancer treatment.  Breast Cancer  Practice breast self-awareness. This means understanding how your breasts normally appear and feel.  It also means doing regular breast self-exams. Let your health care provider know about any changes, no matter how small.  If you are in your 20s or 30s, you should have a clinical breast exam (CBE) by a health care provider every 1-3 years as part of a regular health exam.  If you are 40  or older, have a CBE every year. Also consider having a breast X-ray (mammogram) every year.  If you have a family history of breast cancer, talk to your health care provider about genetic screening.  If you are at high risk for breast cancer, talk to your health care provider about having an MRI and a mammogram every year.  Breast cancer gene (BRCA) assessment is recommended for women who have family members with BRCA-related cancers. BRCA-related cancers include: ? Breast. ? Ovarian. ? Tubal. ? Peritoneal cancers.  Results of the assessment will determine the need  for genetic counseling and BRCA1 and BRCA2 testing.  Cervical Cancer Your health care provider may recommend that you be screened regularly for cancer of the pelvic organs (ovaries, uterus, and vagina). This screening involves a pelvic examination, including checking for microscopic changes to the surface of your cervix (Pap test). You may be encouraged to have this screening done every 3 years, beginning at age 31.  For women ages 64-65, health care providers may recommend pelvic exams and Pap testing every 3 years, or they may recommend the Pap and pelvic exam, combined with testing for human papilloma virus (HPV), every 5 years. Some types of HPV increase your risk of cervical cancer. Testing for HPV may also be done on women of any age with unclear Pap test results.  Other health care providers may not recommend any screening for nonpregnant women who are considered low risk for pelvic cancer and who do not have symptoms. Ask your health care provider if a screening pelvic exam is right for you.  If you have had past treatment for cervical cancer or a condition that could lead to cancer, you need Pap tests and screening for cancer for at least 20 years after your treatment. If Pap tests have been discontinued, your risk factors (such as having a new sexual partner) need to be reassessed to determine if screening should resume. Some women have medical problems that increase the chance of getting cervical cancer. In these cases, your health care provider may recommend more frequent screening and Pap tests.  Colorectal Cancer  This type of cancer can be detected and often prevented.  Routine colorectal cancer screening usually begins at 57 years of age and continues through 57 years of age.  Your health care provider may recommend screening at an earlier age if you have risk factors for colon cancer.  Your health care provider may also recommend using home test kits to check for hidden blood in  the stool.  A small camera at the end of a tube can be used to examine your colon directly (sigmoidoscopy or colonoscopy). This is done to check for the earliest forms of colorectal cancer.  Routine screening usually begins at age 30.  Direct examination of the colon should be repeated every 5-10 years through 57 years of age. However, you may need to be screened more often if early forms of precancerous polyps or small growths are found.  Skin Cancer  Check your skin from head to toe regularly.  Tell your health care provider about any new moles or changes in moles, especially if there is a change in a mole's shape or color.  Also tell your health care provider if you have a mole that is larger than the size of a pencil eraser.  Always use sunscreen. Apply sunscreen liberally and repeatedly throughout the day.  Protect yourself by wearing long sleeves, pants, a wide-brimmed hat, and sunglasses whenever you are  outside.  Heart disease, diabetes, and high blood pressure  High blood pressure causes heart disease and increases the risk of stroke. High blood pressure is more likely to develop in: ? People who have blood pressure in the high end of the normal range (130-139/85-89 mm Hg). ? People who are overweight or obese. ? People who are African American.  If you are 18-39 years of age, have your blood pressure checked every 3-5 years. If you are 40 years of age or older, have your blood pressure checked every year. You should have your blood pressure measured twice-once when you are at a hospital or clinic, and once when you are not at a hospital or clinic. Record the average of the two measurements. To check your blood pressure when you are not at a hospital or clinic, you can use: ? An automated blood pressure machine at a pharmacy. ? A home blood pressure monitor.  If you are between 55 years and 79 years old, ask your health care provider if you should take aspirin to prevent  strokes.  Have regular diabetes screenings. This involves taking a blood sample to check your fasting blood sugar level. ? If you are at a normal weight and have a low risk for diabetes, have this test once every three years after 57 years of age. ? If you are overweight and have a high risk for diabetes, consider being tested at a younger age or more often. Preventing infection Hepatitis B  If you have a higher risk for hepatitis B, you should be screened for this virus. You are considered at high risk for hepatitis B if: ? You were born in a country where hepatitis B is common. Ask your health care provider which countries are considered high risk. ? Your parents were born in a high-risk country, and you have not been immunized against hepatitis B (hepatitis B vaccine). ? You have HIV or AIDS. ? You use needles to inject street drugs. ? You live with someone who has hepatitis B. ? You have had sex with someone who has hepatitis B. ? You get hemodialysis treatment. ? You take certain medicines for conditions, including cancer, organ transplantation, and autoimmune conditions.  Hepatitis C  Blood testing is recommended for: ? Everyone born from 1945 through 1965. ? Anyone with known risk factors for hepatitis C.  Sexually transmitted infections (STIs)  You should be screened for sexually transmitted infections (STIs) including gonorrhea and chlamydia if: ? You are sexually active and are younger than 57 years of age. ? You are older than 57 years of age and your health care provider tells you that you are at risk for this type of infection. ? Your sexual activity has changed since you were last screened and you are at an increased risk for chlamydia or gonorrhea. Ask your health care provider if you are at risk.  If you do not have HIV, but are at risk, it may be recommended that you take a prescription medicine daily to prevent HIV infection. This is called pre-exposure prophylaxis  (PrEP). You are considered at risk if: ? You are sexually active and do not regularly use condoms or know the HIV status of your partner(s). ? You take drugs by injection. ? You are sexually active with a partner who has HIV.  Talk with your health care provider about whether you are at high risk of being infected with HIV. If you choose to begin PrEP, you should first be tested   for HIV. You should then be tested every 3 months for as long as you are taking PrEP. Pregnancy  If you are premenopausal and you may become pregnant, ask your health care provider about preconception counseling.  If you may become pregnant, take 400 to 800 micrograms (mcg) of folic acid every day.  If you want to prevent pregnancy, talk to your health care provider about birth control (contraception). Osteoporosis and menopause  Osteoporosis is a disease in which the bones lose minerals and strength with aging. This can result in serious bone fractures. Your risk for osteoporosis can be identified using a bone density scan.  If you are 7 years of age or older, or if you are at risk for osteoporosis and fractures, ask your health care provider if you should be screened.  Ask your health care provider whether you should take a calcium or vitamin D supplement to lower your risk for osteoporosis.  Menopause may have certain physical symptoms and risks.  Hormone replacement therapy may reduce some of these symptoms and risks. Talk to your health care provider about whether hormone replacement therapy is right for you. Follow these instructions at home:  Schedule regular health, dental, and eye exams.  Stay current with your immunizations.  Do not use any tobacco products including cigarettes, chewing tobacco, or electronic cigarettes.  If you are pregnant, do not drink alcohol.  If you are breastfeeding, limit how much and how often you drink alcohol.  Limit alcohol intake to no more than 1 drink per day for  nonpregnant women. One drink equals 12 ounces of beer, 5 ounces of wine, or 1 ounces of hard liquor.  Do not use street drugs.  Do not share needles.  Ask your health care provider for help if you need support or information about quitting drugs.  Tell your health care provider if you often feel depressed.  Tell your health care provider if you have ever been abused or do not feel safe at home. This information is not intended to replace advice given to you by your health care provider. Make sure you discuss any questions you have with your health care provider. Document Released: 07/29/2010 Document Revised: 06/21/2015 Document Reviewed: 10/17/2014 Elsevier Interactive Patient Education  [employer].

## 2017-03-05 NOTE — Progress Notes (Signed)
   Subjective:    Patient ID: Nicole GarbeConnie Wilcox, female    DOB: 12/26/1960, 57 y.o.   MRN: [id number]  HPI The patient is a 57 YO female coming in for physical. No new concerns except weight.  PMH, [facility], social history reviewed and updated.   Review of Systems  Constitutional: Negative.   HENT: Negative.   Eyes: Negative.   Respiratory: Negative for cough, chest tightness and shortness of breath.   Cardiovascular: Negative for chest pain, palpitations and leg swelling.  Gastrointestinal: Negative for abdominal distention, abdominal pain, constipation, diarrhea, nausea and vomiting.  Musculoskeletal: Negative.   Skin: Negative.   Neurological: Negative.   Psychiatric/Behavioral: Negative.       Objective:   Physical Exam  Constitutional: She is oriented to person, place, and time. She appears well-developed and well-nourished.  HENT:  Head: Normocephalic and atraumatic.  Eyes: EOM are normal.  Neck: Normal range of motion.  Cardiovascular: Normal rate and regular rhythm.  Pulmonary/Chest: Effort normal and breath sounds normal. No respiratory distress. She has no wheezes. She has no rales.  Abdominal: Soft. Bowel sounds are normal. She exhibits no distension. There is no tenderness. There is no rebound.  Musculoskeletal: She exhibits no edema.  Neurological: She is alert and oriented to person, place, and time. Coordination normal.  Skin: Skin is warm and dry.  Psychiatric: She has a normal mood and affect.   Vitals:   03/05/17 0801  BP: 120/86  Pulse: 67  Temp: 98 F (36.7 C)  TempSrc: Oral  SpO2: 98%  Weight: 216 lb (98 kg)  Height: 5\' 6"  (1.676 m)      Assessment & Plan:

## 2017-04-09 ENCOUNTER — Ambulatory Visit: Payer: Federal, State, Local not specified - PPO | Admitting: Nurse Practitioner

## 2017-05-25 ENCOUNTER — Ambulatory Visit (INDEPENDENT_AMBULATORY_CARE_PROVIDER_SITE_OTHER): Payer: Federal, State, Local not specified - PPO

## 2017-05-25 DIAGNOSIS — Z299 Encounter for prophylactic measures, unspecified: Secondary | ICD-10-CM

## 2017-05-25 DIAGNOSIS — Z23 Encounter for immunization: Secondary | ICD-10-CM | POA: Diagnosis not present

## 2017-08-03 ENCOUNTER — Ambulatory Visit (INDEPENDENT_AMBULATORY_CARE_PROVIDER_SITE_OTHER): Payer: Federal, State, Local not specified - PPO

## 2017-08-03 DIAGNOSIS — Z23 Encounter for immunization: Secondary | ICD-10-CM | POA: Diagnosis not present

## 2017-08-03 DIAGNOSIS — Z299 Encounter for prophylactic measures, unspecified: Secondary | ICD-10-CM

## 2017-08-05 ENCOUNTER — Telehealth: Payer: Self-pay | Admitting: *Deleted

## 2017-08-05 NOTE — Telephone Encounter (Signed)
Okay with benadryl for itching and ice to the area. Tylenol for pain if needed. Call back if worsening or not gone in 1-2 weeks.

## 2017-08-05 NOTE — Telephone Encounter (Signed)
Left detailed mess informing pt of below.  

## 2017-08-05 NOTE — Telephone Encounter (Signed)
FYI: Patient walked in to office today for evaluation of left deltoid redness and itching. She received her 2nd Shingrix vaccine on 08/03/17 in our office. See immunizations.   She has minimal redness and swelling with itching at the injection site. She has a temp of 97.7 now. She denies limited ROM and severe pain in the arm today.   I advised her these sxs can be expected for some, to take OTC benadryl and APAP as directed on bottle and to come back in to see MD if her sxs have not subsided by 08/07/17. Pt verbalized understanding.

## 2017-11-05 ENCOUNTER — Other Ambulatory Visit (INDEPENDENT_AMBULATORY_CARE_PROVIDER_SITE_OTHER): Payer: Federal, State, Local not specified - PPO

## 2017-11-05 ENCOUNTER — Ambulatory Visit (INDEPENDENT_AMBULATORY_CARE_PROVIDER_SITE_OTHER)
Admission: RE | Admit: 2017-11-05 | Discharge: 2017-11-05 | Disposition: A | Discharge status: Home / Self Care | Payer: Federal, State, Local not specified - PPO | Source: Ambulatory Visit | Attending: Internal Medicine | Admitting: Internal Medicine

## 2017-11-05 ENCOUNTER — Ambulatory Visit: Payer: Federal, State, Local not specified - PPO | Admitting: Internal Medicine

## 2017-11-05 ENCOUNTER — Encounter: Payer: Self-pay | Admitting: Internal Medicine

## 2017-11-05 VITALS — BP 140/90 | HR 54 | Temp 98.4°F | Ht 66.0 in | Wt 207.0 lb

## 2017-11-05 DIAGNOSIS — R7989 Other specified abnormal findings of blood chemistry: Secondary | ICD-10-CM | POA: Diagnosis not present

## 2017-11-05 DIAGNOSIS — M549 Dorsalgia, unspecified: Secondary | ICD-10-CM

## 2017-11-05 LAB — TSH: TSH: 1.8 u[IU]/mL (ref 0.35–4.50)

## 2017-11-05 LAB — T4, FREE: Free T4: 0.65 ng/dL (ref 0.60–1.60)

## 2017-11-05 MED ORDER — METHYLPREDNISOLONE ACETATE 40 MG/ML IJ SUSP
40.0000 mg | Freq: Once | INTRAMUSCULAR | Status: AC
  Administered 2017-11-05: 40 mg via INTRAMUSCULAR

## 2017-11-05 NOTE — Assessment & Plan Note (Signed)
Depo-medrol 40 mg IM given at visit. Checking x-ray thoracic spine given time course and lack of improvement. Can continue otc pain medications.

## 2017-11-05 NOTE — Progress Notes (Signed)
   Subjective:    Patient ID: Nicole Wilcox, female    DOB: May 29, 1960, 57 y.o.   MRN: [id number]  HPI  The patient is a 57 YO female coming in for middle back pain. Started about 1-2 months ago. She caught her husband while he was falling and she is not sure if that was around the same time this started. Denies other known injury or overuse. She denies prior pain like this in her back. Does not radiate. Stays in her back. Does not go through to her chest. She denies SOB or cough. Denies palpitations or chest pains. Denies arm pain or numbness or weakness. Denies bending or twisting brings the pain on. About 3-4/10 all the time with flare to 8/10 which seem to be random. Overall it is fairly stable since onset. Not improving. Has tried otc medications for pain which are helpful but she is just concerned it is not improving.  Cytomel 2 tabs daily.  Review of Systems  Constitutional: Positive for activity change. Negative for appetite change, chills, fatigue, fever and unexpected weight change.  Respiratory: Negative.   Cardiovascular: Negative.   Gastrointestinal: Negative.   Musculoskeletal: Positive for arthralgias, back pain and myalgias. Negative for gait problem and joint swelling.  Skin: Negative.   Neurological: Negative.       Objective:   Physical Exam  Constitutional: She is oriented to person, place, and time. She appears well-developed and well-nourished.  HENT:  Head: Normocephalic and atraumatic.  Eyes: EOM are normal.  Neck: Normal range of motion.  Cardiovascular: Normal rate and regular rhythm.  Pulmonary/Chest: Effort normal and breath sounds normal. No respiratory distress. She has no wheezes. She has no rales.  Abdominal: Soft. She exhibits no distension. There is no tenderness. There is no rebound.  Musculoskeletal: She exhibits tenderness. She exhibits no edema.  Pain thoracic midline, no pain in the scapular region.   Neurological: She is alert and oriented to  person, place, and time. Coordination normal.  Skin: Skin is warm and dry.   Vitals:   11/05/17 0840  BP: 140/90  Pulse: (!) 54  Temp: 98.4 F (36.9 C)  TempSrc: Oral  SpO2: 97%  Weight: 207 lb (93.9 kg)  Height: 5\' 6"  (1.676 m)      Assessment & Plan:  Depo-medrol 40 mg IM

## 2017-11-05 NOTE — Assessment & Plan Note (Signed)
Previously reported at an outside facility and put on cytomel 10 mg daily. She is not sure if this is needed and did not feel different before or after. It was done to help her lose weight. Checking TSH and free T4. Talked to her about the volatility of T3 levels and why we do not monitor those. If thyroid normal will stop and then monitor again in 2-3 months.

## 2017-11-05 NOTE — Patient Instructions (Signed)
We have given you a shot to see if it helps.   We are checking the x-ray and the thyroid levels today.

## 2017-11-12 ENCOUNTER — Other Ambulatory Visit: Payer: Self-pay | Admitting: Internal Medicine

## 2018-02-05 ENCOUNTER — Other Ambulatory Visit: Payer: Self-pay | Admitting: Internal Medicine

## 2018-03-03 ENCOUNTER — Ambulatory Visit: Payer: Federal, State, Local not specified - PPO | Admitting: Family Medicine

## 2018-03-03 ENCOUNTER — Encounter: Payer: Self-pay | Admitting: Family

## 2018-03-03 ENCOUNTER — Ambulatory Visit (INDEPENDENT_AMBULATORY_CARE_PROVIDER_SITE_OTHER): Payer: Federal, State, Local not specified - PPO | Admitting: Family

## 2018-03-03 ENCOUNTER — Encounter: Payer: Self-pay | Admitting: Family Medicine

## 2018-03-03 ENCOUNTER — Ambulatory Visit (INDEPENDENT_AMBULATORY_CARE_PROVIDER_SITE_OTHER): Payer: Federal, State, Local not specified - PPO

## 2018-03-03 VITALS — BP 144/82 | HR 54 | Temp 97.6°F | Ht 66.0 in | Wt 217.0 lb

## 2018-03-03 VITALS — BP 120/84 | Ht 66.0 in | Wt 217.0 lb

## 2018-03-03 DIAGNOSIS — M25562 Pain in left knee: Secondary | ICD-10-CM | POA: Diagnosis not present

## 2018-03-03 MED ORDER — DICLOFENAC SODIUM 2 % TD SOLN: 1.0000 "application " | Freq: Two times a day (BID) | TRANSDERMAL | 3 refills | Status: DC

## 2018-03-03 NOTE — Progress Notes (Signed)
Nicole Wilcox - 58 y.o. female MRN [id number]  Date of birth: 19-Nov-1960  SUBJECTIVE:  Including CC & ROS.  Chief Complaint  Patient presents with  . Knee Pain    left x1 week    Nicole Wilcox is a 58 y.o. female that is presenting with acute left knee pain.  She experienced the pain over the past few weeks.  The pain is anterior as well along the medial joint line.  Denies any mechanical symptoms.  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  Pain is worse with prolonged walking and going up and down stairs.  Denies any inciting event.  Pain is localized to the knee.  Pain is sharp and throbbing.  No improvement with over-the-counter modalities..    Review of Systems  Constitutional: Negative for fever.  HENT: Negative for congestion.   Respiratory: Negative for cough.   Cardiovascular: Negative for chest pain.  Gastrointestinal: Negative for abdominal pain.  Musculoskeletal: Positive for gait problem.  Skin: Negative for color change.  Neurological: Negative for weakness.  Hematological: Negative for adenopathy.  Psychiatric/Behavioral: Negative for agitation.    HISTORY: Past Medical, Surgical, Social, and Family History Reviewed & Updated per EMR.   Pertinent Historical Findings include:  Past Medical History:  Diagnosis Date  . Arthritis     Past Surgical History:  Procedure Laterality Date  . ABDOMINAL HYSTERECTOMY    . CHOLECYSTECTOMY    . GALLBLADDER SURGERY    . HERNIA REPAIR    . HIP SURGERY     S/P 3 separate hip surgeries in 2004  . INGUINAL HERNIA REPAIR    . TOTAL VAGINAL HYSTERECTOMY      No Known Allergies  Family History  Problem Relation Age of Onset  . Heart failure Mother   . Asthma Mother   . Cancer Father   . Diabetes Maternal Grandmother   . Hypertension Maternal Grandmother   . Stroke Maternal Grandmother   . Heart disease Maternal Grandfather   . Cancer Paternal Grandfather   . Diabetes Brother      Social History     Socioeconomic History  . Marital status: Married    Spouse name: Not on file  . Number of children: Not on file  . Years of education: Not on file  . Highest education level: Not on file  Occupational History  . Occupation: retired    Associate Professor: [employer]  Social Needs  . Financial resource strain: Not on file  . Food insecurity:    Worry: Not on file    Inability: Not on file  . Transportation needs:    Medical: Not on file    Non-medical: Not on file  Tobacco Use  . Smoking status: Former Smoker    Packs/day: 1.00    Types: Cigarettes  . Smokeless tobacco: Former Neurosurgeon  . Tobacco comment: quit 5 months ago  Substance and Sexual Activity  . Alcohol use: No  . Drug use: No  . Sexual activity: Yes    Partners: Male  Lifestyle  . Physical activity:    Days per week: Not on file    Minutes per session: Not on file  . Stress: Not on file  Relationships  . Social connections:    Talks on phone: Not on file    Gets together: Not on file    Attends religious service: Not on file    Active member of club or organization: Not on file  Attends meetings of clubs or organizations: Not on file    Relationship status: Not on file  . Intimate partner violence:    Fear of current or ex partner: Not on file    Emotionally abused: Not on file    Physically abused: Not on file    Forced sexual activity: Not on file  Other Topics Concern  . Not on file  Social History Narrative   Exercises alittle.     PHYSICAL EXAM:  VS: BP 120/84   Ht 5\' 6"  (1.676 m)   Wt 217 lb (98.4 kg)   BMI 35.02 kg/m  Physical Exam Gen: NAD, alert, cooperative with exam, well-appearing ENT: normal lips, normal nasal mucosa,  Eye: normal EOM, normal conjunctiva and lids CV:  no edema, +2 pedal pulses   Resp: no accessory muscle use, non-labored,  Skin: no rashes, no areas of induration  Neuro: normal tone, normal sensation to touch Psych:  normal insight, alert and oriented MSK:  Left  knee: No obvious effusion. No significant tenderness along the medial or lateral joint line. Normal range of motion. Normal strength resistance. No significant instability with valgus or varus stress testing. McMurray's test. No pain with patellar grind. Neurovascular intact  Limited ultrasound: left knee:  Trace to mild effusion  Normal appearing QT and PT  Medial joint line with medial joint space narrowing. No significant medial meniscus outpouching  Normal appearing lateral joint line.   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  Has some degenerative changes that could be contributing as well. -Counseled on home exercise therapy and supportive care. -Provided Pennsaid. -If no improvement can consider imaging and injection

## 2018-03-03 NOTE — Patient Instructions (Signed)
Nice to meet you  Please try the medicine  Please try the exercises  Please try riding a recumbent bike or working out in the water  Please see me back in 3-4 weeks if no better.

## 2018-03-03 NOTE — Progress Notes (Signed)
  Anfal Fiorito is a 58 y.o. female with the following history as recorded in [employer]:  Patient Active Problem List   Diagnosis Date Noted  . Mid back pain 11/05/2017  . 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 Pain following surgery or procedure 02/04/2016  . Anxiety and depression 08/22/2014  . Chronic left hip pain 02/28/2011  . Obesity 02/28/2011  . Health care maintenance 02/28/2011    Current Outpatient Medications  Medication Sig Dispense Refill  . acetaminophen (TYLENOL) 650 MG suppository Place 650 mg rectally every 4 (four) hours as needed.    Marland Kitchen b complex vitamins tablet Take 1 tablet by mouth daily.    . cholecalciferol (VITAMIN D) 400 UNITS TABS Take by mouth daily.    . Chromium Picolinate 500 MCG TABS Take by mouth daily.    . DULoxetine (CYMBALTA) 30 MG capsule TAKE 1 CAPSULE(30 MG) BY MOUTH DAILY 90 capsule 0  . liothyronine (CYTOMEL) 5 MCG tablet TK 2 TS PO QD  0  . Multiple Vitamin (MULTIVITAMIN) tablet Take 1 tablet by mouth daily.    . Potassium 99 MG TABS Take by mouth.     No current facility-administered medications for this visit.     Allergies: Patient has no known allergies.  Past Medical History:  Diagnosis Date  . Arthritis     Past Surgical History:  Procedure Laterality Date  . ABDOMINAL HYSTERECTOMY    . CHOLECYSTECTOMY    . GALLBLADDER SURGERY    . HERNIA REPAIR    . HIP SURGERY     S/P 3 separate hip surgeries in 2004  . INGUINAL HERNIA REPAIR    . TOTAL VAGINAL HYSTERECTOMY      Family History  Problem Relation Age of Onset  . Heart failure Mother   . Asthma Mother   . Cancer Father   . Diabetes Maternal Grandmother   . Hypertension Maternal Grandmother   . Stroke Maternal Grandmother   . Heart disease Maternal Grandfather   . Cancer Paternal Grandfather   . Diabetes Brother     Social History   Tobacco Use  . Smoking status: Former Smoker    Packs/day: 1.00    Types: Cigarettes  . Smokeless tobacco: Former Neurosurgeon  .  Tobacco comment: quit 5 months ago  Substance Use Topics  . Alcohol use: No    Subjective:  1 week history of left knee pain/ swelling/ "feels hot to touch." Notes that is painful to bear weight;       Objective:  Vitals:   03/03/18 0934  BP: (!) 144/82  Pulse: (!) 54  Temp: 97.6 F (36.4 C)  TempSrc: Oral  SpO2: 96%  Weight: 217 lb (98.4 kg)  Height: 5\' 6"  (1.676 m)    Appointment cancelled here; sent to sports medicine No follow-ups on file.  No orders of the defined types were placed in this encounter.   Requested Prescriptions    No prescriptions requested or ordered in this encounter

## 2018-03-04 DIAGNOSIS — M25562 Pain in left knee: Secondary | ICD-10-CM | POA: Insufficient documentation

## 2018-03-04 NOTE — Assessment & Plan Note (Signed)
Pain is likely related to patellofemoral syndrome.  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  Has some degenerative changes that could be contributing as well. -Counseled on home exercise therapy and supportive care. -Provided Pennsaid. -If no improvement can consider imaging and injection

## 2018-03-17 LAB — HM MAMMOGRAPHY

## 2018-03-18 ENCOUNTER — Encounter: Payer: Self-pay | Admitting: Internal Medicine

## 2018-03-18 NOTE — Progress Notes (Signed)
Abstracted and sent to scan  

## 2018-05-01 ENCOUNTER — Other Ambulatory Visit: Payer: Self-pay | Admitting: Internal Medicine

## 2018-06-28 ENCOUNTER — Encounter: Payer: Self-pay | Admitting: Internal Medicine

## 2018-06-28 ENCOUNTER — Other Ambulatory Visit (INDEPENDENT_AMBULATORY_CARE_PROVIDER_SITE_OTHER): Payer: Federal, State, Local not specified - PPO

## 2018-06-28 ENCOUNTER — Other Ambulatory Visit: Payer: Self-pay

## 2018-06-28 ENCOUNTER — Ambulatory Visit (INDEPENDENT_AMBULATORY_CARE_PROVIDER_SITE_OTHER): Payer: Federal, State, Local not specified - PPO | Admitting: Internal Medicine

## 2018-06-28 VITALS — BP 130/86 | HR 46 | Temp 98.1°F | Ht 66.0 in | Wt 219.0 lb

## 2018-06-28 DIAGNOSIS — F419 Anxiety disorder, unspecified: Secondary | ICD-10-CM

## 2018-06-28 DIAGNOSIS — E6609 Other obesity due to excess calories: Secondary | ICD-10-CM

## 2018-06-28 DIAGNOSIS — M25552 Pain in left hip: Secondary | ICD-10-CM | POA: Diagnosis not present

## 2018-06-28 DIAGNOSIS — Z0000 Encounter for general adult medical examination without abnormal findings: Secondary | ICD-10-CM

## 2018-06-28 DIAGNOSIS — G8929 Other chronic pain: Secondary | ICD-10-CM

## 2018-06-28 DIAGNOSIS — F329 Major depressive disorder, single episode, unspecified: Secondary | ICD-10-CM

## 2018-06-28 DIAGNOSIS — Z6835 Body mass index (BMI) 35.0-35.9, adult: Secondary | ICD-10-CM

## 2018-06-28 LAB — COMPREHENSIVE METABOLIC PANEL
ALT: 15 U/L (ref 0–35)
AST: 23 U/L (ref 0–37)
Albumin: 4.4 g/dL (ref 3.5–5.2)
Alkaline Phosphatase: 61 U/L (ref 39–117)
BUN: 20 mg/dL (ref 6–23)
CO2: 28 mEq/L (ref 19–32)
Calcium: 9.7 mg/dL (ref 8.4–10.5)
Chloride: 103 mEq/L (ref 96–112)
Creatinine, Ser: 0.76 mg/dL (ref 0.40–1.20)
GFR: 78.08 mL/min (ref >60.00)
Glucose, Bld: 85 mg/dL (ref 70–99)
Potassium: 4.5 mEq/L (ref 3.5–5.1)
Sodium: 140 mEq/L (ref 135–145)
Total Bilirubin: 0.5 mg/dL (ref 0.2–1.2)
Total Protein: 7 g/dL (ref 6.0–8.3)

## 2018-06-28 LAB — CBC
HCT: 38.5 % (ref 36.0–46.0)
Hemoglobin: 13.1 g/dL (ref 12.0–15.0)
MCHC: 34.2 g/dL (ref 30.0–36.0)
MCV: 92.8 fl (ref 78.0–100.0)
Platelets: 234 10*3/uL (ref 150.0–400.0)
RBC: 4.15 Mil/uL (ref 3.87–5.11)
RDW: 13.3 % (ref 11.5–15.5)
WBC: 5.6 10*3/uL (ref 4.0–10.5)

## 2018-06-28 LAB — LIPID PANEL
Cholesterol: 203 mg/dL — ABNORMAL HIGH (ref 0–200)
HDL: 78.2 mg/dL (ref >39.00)
LDL Cholesterol: 106 mg/dL — ABNORMAL HIGH (ref 0–99)
NonHDL: 125.11
Total CHOL/HDL Ratio: 3
Triglycerides: 96 mg/dL (ref 0.0–149.0)
VLDL: 19.2 mg/dL (ref 0.0–40.0)

## 2018-06-28 LAB — HEMOGLOBIN A1C: Hgb A1c MFr Bld: 5.4 % (ref 4.6–6.5)

## 2018-06-28 LAB — TSH: TSH: 2.03 u[IU]/mL (ref 0.35–4.50)

## 2018-06-28 NOTE — Patient Instructions (Signed)

## 2018-06-28 NOTE — Progress Notes (Signed)
   Subjective:   Patient ID: Nicole Wilcox, female    DOB: 16-Oct-1960, 58 y.o.   MRN: [id number]  HPI The patient is a 58 YO female coming in for physical. Eating more due to stress due to pandemic.   PMH, [facility], social history reviewed and updated  Review of Systems  Constitutional: Negative.   HENT: Negative.   Eyes: Negative.   Respiratory: Negative for cough, chest tightness and shortness of breath.   Cardiovascular: Negative for chest pain, palpitations and leg swelling.  Gastrointestinal: Negative for abdominal distention, abdominal pain, constipation, diarrhea, nausea and vomiting.  Musculoskeletal: Negative.   Skin: Negative.   Neurological: Negative.   Psychiatric/Behavioral: Negative.     Objective:  Physical Exam Constitutional:      Appearance: She is well-developed. She is obese.  HENT:     Head: Normocephalic and atraumatic.  Neck:     Musculoskeletal: Normal range of motion.  Cardiovascular:     Rate and Rhythm: Normal rate and regular rhythm.  Pulmonary:     Effort: Pulmonary effort is normal. No respiratory distress.     Breath sounds: Normal breath sounds. No wheezing or rales.  Abdominal:     General: Bowel sounds are normal. There is no distension.     Palpations: Abdomen is soft.     Tenderness: There is no abdominal tenderness. There is no rebound.  Skin:    General: Skin is warm and dry.  Neurological:     Mental Status: She is alert and oriented to person, place, and time.     Coordination: Coordination normal.     Vitals:   06/28/18 0858  BP: 130/86  Pulse: (!) 46  Temp: 98.1 F (36.7 C)  TempSrc: Oral  SpO2: 97%  Weight: 219 lb (99.3 kg)  Height: 5\' 6"  (1.676 m)    Assessment & Plan:

## 2018-06-28 NOTE — Assessment & Plan Note (Signed)
Taking cymbalta and her husband's alcohol abuse does impact this. She denies need for change in therapy at this time.

## 2018-06-28 NOTE — Assessment & Plan Note (Signed)
Given information about qsymia, saxenda, and contrave as options for weight management.

## 2018-06-28 NOTE — Assessment & Plan Note (Signed)
Counseled about safe limits for tylenol 3G daily.

## 2018-06-28 NOTE — Assessment & Plan Note (Signed)
Flu shot up to date. Shingrix complete. Tetanus up to date. Colonoscopy up to date. Mammogram up to date, pap smear up to date. Counseled about sun safety and mole surveillance. Counseled about the dangers of distracted driving. Given 10 year screening recommendations.   

## 2018-07-27 ENCOUNTER — Other Ambulatory Visit: Payer: Self-pay | Admitting: Internal Medicine

## 2018-08-15 ENCOUNTER — Emergency Department (HOSPITAL_COMMUNITY): Payer: Federal, State, Local not specified - PPO

## 2018-08-15 ENCOUNTER — Emergency Department (HOSPITAL_COMMUNITY)
Admission: EM | Admit: 2018-08-15 | Discharge: 2018-08-16 | Disposition: A | Discharge status: Home / Self Care | Payer: Federal, State, Local not specified - PPO | Attending: Emergency Medicine | Admitting: Emergency Medicine

## 2018-08-15 ENCOUNTER — Other Ambulatory Visit: Payer: Self-pay

## 2018-08-15 ENCOUNTER — Encounter (HOSPITAL_COMMUNITY): Payer: Self-pay | Admitting: *Deleted

## 2018-08-15 DIAGNOSIS — R42 Dizziness and giddiness: Secondary | ICD-10-CM | POA: Insufficient documentation

## 2018-08-15 DIAGNOSIS — Z87891 Personal history of nicotine dependence: Secondary | ICD-10-CM | POA: Insufficient documentation

## 2018-08-15 DIAGNOSIS — R110 Nausea: Secondary | ICD-10-CM | POA: Diagnosis not present

## 2018-08-15 DIAGNOSIS — Z79899 Other long term (current) drug therapy: Secondary | ICD-10-CM | POA: Insufficient documentation

## 2018-08-15 DIAGNOSIS — I6782 Cerebral ischemia: Secondary | ICD-10-CM | POA: Insufficient documentation

## 2018-08-15 HISTORY — DX: Bradycardia, unspecified: R00.1

## 2018-08-15 LAB — CBC
HCT: 41.6 % (ref 36.0–46.0)
Hemoglobin: 13.2 g/dL (ref 12.0–15.0)
MCH: 30.3 pg (ref 26.0–34.0)
MCHC: 31.7 g/dL (ref 30.0–36.0)
MCV: 95.4 fL (ref 80.0–100.0)
Platelets: 202 10*3/uL (ref 150–400)
RBC: 4.36 MIL/uL (ref 3.87–5.11)
RDW: 12.6 % (ref 11.5–15.5)
WBC: 6.7 10*3/uL (ref 4.0–10.5)
nRBC: 0 % (ref 0.0–0.2)

## 2018-08-15 LAB — BASIC METABOLIC PANEL
Anion gap: 14 (ref 5–15)
BUN: 16 mg/dL (ref 6–20)
CO2: 21 mmol/L — ABNORMAL LOW (ref 22–32)
Calcium: 9.1 mg/dL (ref 8.9–10.3)
Chloride: 104 mmol/L (ref 98–111)
Creatinine, Ser: 0.76 mg/dL (ref 0.44–1.00)
GFR calc Af Amer: >60 mL/min (ref >60)
GFR calc non Af Amer: >60 mL/min (ref >60)
Glucose, Bld: 120 mg/dL — ABNORMAL HIGH (ref 70–99)
Potassium: 3.6 mmol/L (ref 3.5–5.1)
Sodium: 139 mmol/L (ref 135–145)

## 2018-08-15 LAB — URINALYSIS, ROUTINE W REFLEX MICROSCOPIC
Bilirubin Urine: NEGATIVE
Glucose, UA: NEGATIVE mg/dL
Hgb urine dipstick: NEGATIVE
Ketones, ur: 20 mg/dL — AB
Leukocytes,Ua: NEGATIVE
Nitrite: NEGATIVE
Protein, ur: NEGATIVE mg/dL
Specific Gravity, Urine: 1.015 (ref 1.005–1.030)
pH: 8 (ref 5.0–8.0)

## 2018-08-15 LAB — CBG MONITORING, ED: Glucose-Capillary: 95 mg/dL (ref 70–99)

## 2018-08-15 MED ORDER — MECLIZINE HCL 25 MG PO TABS
25.0000 mg | ORAL_TABLET | Freq: Once | ORAL | Status: AC
  Administered 2018-08-15: 18:00:00 25 mg via ORAL
  Filled 2018-08-15: qty 1

## 2018-08-15 MED ORDER — ONDANSETRON HCL 4 MG/2ML IJ SOLN
4.0000 mg | Freq: Once | INTRAMUSCULAR | Status: AC
  Administered 2018-08-15: 4 mg via INTRAVENOUS
  Filled 2018-08-15: qty 2

## 2018-08-15 MED ORDER — LORAZEPAM 2 MG/ML IJ SOLN
1.0000 mg | Freq: Once | INTRAMUSCULAR | Status: AC
  Administered 2018-08-15: 1 mg via INTRAVENOUS
  Filled 2018-08-15: qty 1

## 2018-08-15 MED ORDER — SODIUM CHLORIDE 0.9 % IV BOLUS
1000.0000 mL | Freq: Once | INTRAVENOUS | Status: AC
  Administered 2018-08-15: 1000 mL via INTRAVENOUS

## 2018-08-15 MED ORDER — DIAZEPAM 5 MG/ML IJ SOLN
5.0000 mg | Freq: Once | INTRAMUSCULAR | Status: AC
  Administered 2018-08-15: 5 mg via INTRAVENOUS
  Filled 2018-08-15: qty 2

## 2018-08-15 MED ORDER — SODIUM CHLORIDE 0.9% FLUSH: 3.0000 mL | Freq: Once | INTRAVENOUS | Status: DC

## 2018-08-15 NOTE — ED Notes (Signed)
Patient unable to ambulate due to dizziness. Patient on edge of bed and states that "the room will not stop spinning." Assisted patient back to bed and side rails up. EDP made aware.

## 2018-08-15 NOTE — ED Notes (Signed)
Patient transported to MRI 

## 2018-08-15 NOTE — ED Notes (Addendum)
Asked pt to ambulate but pt stated she was still too dizzy at this time.

## 2018-08-15 NOTE — ED Provider Notes (Signed)
Assumed care from Dr. Judd Lienelo at shift change.  See prior notes for full H&P.  Briefly, 58 y.o. F here with dizziness after being at her pool this morning.  Dizziness was sudden onset, described as spinning, having trouble walking.  Initial head CT and labs were normal but after meclizine and ativan still unable to walk.  Plan:  MRI pending.  If negative and still not able to ambulate, may need admission.  Results for orders placed or performed during the hospital encounter of 08/15/18  Basic metabolic panel  Result Value Ref Range   Sodium 139 135 - 145 mmol/L   Potassium 3.6 3.5 - 5.1 mmol/L   Chloride 104 98 - 111 mmol/L   CO2 21 (L) 22 - 32 mmol/L   Glucose, Bld 120 (H) 70 - 99 mg/dL   BUN 16 6 - 20 mg/dL   Creatinine, Ser 1.610.76 0.44 - 1.00 mg/dL   Calcium 9.1 8.9 - 09.610.3 mg/dL   GFR calc non Af Amer >60 >60 mL/min   GFR calc Af Amer >60 >60 mL/min   Anion gap 14 5 - 15  CBC  Result Value Ref Range   WBC 6.7 4.0 - 10.5 K/uL   RBC 4.36 3.87 - 5.11 MIL/uL   Hemoglobin 13.2 12.0 - 15.0 g/dL   HCT 04.541.6 40.936.0 - 81.146.0 %   MCV 95.4 80.0 - 100.0 fL   MCH 30.3 26.0 - 34.0 pg   MCHC 31.7 30.0 - 36.0 g/dL   RDW 91.412.6 78.211.5 - 95.615.5 %   Platelets 202 150 - 400 K/uL   nRBC 0.0 0.0 - 0.2 %  Urinalysis, Routine w reflex microscopic  Result Value Ref Range   Color, Urine YELLOW YELLOW   APPearance CLEAR CLEAR   Specific Gravity, Urine 1.015 1.005 - 1.030   pH 8.0 5.0 - 8.0   Glucose, UA NEGATIVE NEGATIVE mg/dL   Hgb urine dipstick NEGATIVE NEGATIVE   Bilirubin Urine NEGATIVE NEGATIVE   Ketones, ur 20 (A) NEGATIVE mg/dL   Protein, ur NEGATIVE NEGATIVE mg/dL   Nitrite NEGATIVE NEGATIVE   Leukocytes,Ua NEGATIVE NEGATIVE  CBG monitoring, ED  Result Value Ref Range   Glucose-Capillary 95 70 - 99 mg/dL   Ct Head Wo Contrast  Result Date: 08/15/2018 CLINICAL DATA:  Altered mental status. EXAM: CT HEAD WITHOUT CONTRAST TECHNIQUE: Contiguous axial images were obtained from the base of the skull  through the vertex without intravenous contrast. COMPARISON:  None. FINDINGS: Brain: Ventricles, cisterns and other CSF spaces are normal. There is no mass, mass effect, shift of midline structures or acute hemorrhage. No evidence of acute infarction. Vascular: No hyperdense vessel or unexpected calcification. Skull: Normal. Negative for fracture or focal lesion. Sinuses/Orbits: No acute finding. Other: None. IMPRESSION: No acute findings. Electronically Signed   By: Elberta Fortisaniel  Boyle M.D.   On: 08/15/2018 18:41   Mr Brain Wo Contrast  Result Date: 08/15/2018 CLINICAL DATA:  Dizziness, vomiting, and altered mental status today. EXAM: MRI HEAD WITHOUT CONTRAST TECHNIQUE: Multiplanar, multiecho pulse sequences of the brain and surrounding structures were obtained without intravenous contrast. COMPARISON:  Head CT 08/15/2018 FINDINGS: The study is intermittently moderately motion degraded. Brain: There is no evidence of acute infarct, intracranial hemorrhage, mass, midline shift, or extra-axial fluid collection. The ventricles and sulci are within normal limits for age. Scattered cerebral white matter T2 hyperintensities are nonspecific but compatible with mildly age advanced chronic small vessel ischemic disease. A dilated perivascular space is incidentally noted in the anterior limb  of the right internal capsule. A nonenlarged partially empty sella is noted. Vascular: Major intracranial vascular flow voids are preserved. Skull and upper cervical spine: Unremarkable bone marrow signal. Sinuses/Orbits: Unremarkable orbits. Paranasal sinuses and mastoid air cells are clear. Other: None. IMPRESSION: 1. No acute intracranial abnormality. 2. Mild chronic small vessel ischemic disease. Electronically Signed   By: Logan Bores M.D.   On: 08/15/2018 22:30   MRI negative.  Results discussed with patient.  States she does feel somewhat better from prior, however still feels a spinning sensation in her head when she moves.   Will give additional fluids and IV Valium.  1:52 AM Patient unable to even stand after additional medications.  She began swaying at bedside , closed eyes and it improved but once moving again, felt extremely dizzy and had to sit back down.  I have talked with patient regarding options of admission vs continued symptomatic treatment at home-- she wants to go home and feels safe doing so.  Husband is home with her and can assist temporarily.  Will have her continue meclizine TID PRN.  Can follow-up with PCP, also given ENT follow-up if symptoms not responding to medications.  Encouraged good oral hydration.   Return here for any new/acute changes.    Larene Pickett, PA-C 08/16/18 5009    Randal Buba, April, MD 08/16/18 [phone]

## 2018-08-15 NOTE — ED Provider Notes (Signed)
[facility] DEPT Provider Note   CSN: [id number] Arrival date & time: 08/15/18  1700     History   Chief Complaint Chief Complaint  Patient presents with  . Dizziness    HPI Naria Abbey is a 58 y.o. female.     Patient is a 58 year old female with past medical history of arthritis and bradycardia.  She presents today for evaluation of dizziness.  Patient states that she was outside in her swimming pool this afternoon for a short period of time.  She then went inside and developed the sudden onset of dizziness.  She describes this as a spinning sensation with associated nausea but no vomiting.  Patient's symptoms became severe and she was unable to stand and walk.  She called for help and a neighbor found her and called 911.  Patient denies any hearing loss or ringing in the ears.  The history is provided by the patient.  Dizziness Quality:  Room spinning Severity:  Severe Onset quality:  Sudden Duration:  3 hours Timing:  Constant Progression:  Worsening Chronicity:  New Context: head movement and standing up   Relieved by:  Being still Worsened by:  Movement, standing up, turning head and sitting upright   Past Medical History:  Diagnosis Date  . Arthritis   . Bradycardia     Patient Active Problem List   Diagnosis Date Noted  . Acute pain of left knee 03/04/2018  . Mid back pain 11/05/2017  . 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 Pain following surgery or procedure 02/04/2016  . Anxiety and depression 08/22/2014  . Chronic left hip pain 02/28/2011  . Obesity 02/28/2011  . Health care maintenance 02/28/2011    Past Surgical History:  Procedure Laterality Date  . ABDOMINAL HYSTERECTOMY    . CHOLECYSTECTOMY    . GALLBLADDER SURGERY    . HERNIA REPAIR    . HIP SURGERY     S/P 3 separate hip surgeries in 2004  . INGUINAL HERNIA REPAIR    . TOTAL VAGINAL HYSTERECTOMY       OB History   No obstetric history on  file.      Home Medications    Prior to Admission medications   Medication Sig Start Date End Date Taking? Authorizing Provider  acetaminophen (TYLENOL) 650 MG CR tablet Take 650 mg by mouth every 8 (eight) hours as needed for pain.    [provider]  b complex vitamins tablet Take 1 tablet by mouth daily.    [provider]  cholecalciferol (VITAMIN D) 400 UNITS TABS Take by mouth daily.    [provider]  Chromium Picolinate 500 MCG TABS Take by mouth daily.    [provider]  Diclofenac Sodium (PENNSAID) 2 % SOLN Place 1 application onto the skin 2 (two) times daily. 03/03/18   Rosemarie Ax, MD  DULoxetine (CYMBALTA) 30 MG capsule TAKE 1 CAPSULE(30 MG) BY MOUTH DAILY 07/27/18   Hoyt Koch, MD  liothyronine (CYTOMEL) 5 MCG tablet TK 2 TS PO QD 08/30/17   [provider]  Multiple Vitamin (MULTIVITAMIN) tablet Take 1 tablet by mouth daily.    [provider]  Potassium 99 MG TABS Take by mouth.    [provider]    Family History Family History  Problem Relation Age of Onset  . Heart failure Mother   . Asthma Mother   . Cancer Father   . Diabetes Maternal Grandmother   . Hypertension Maternal  Grandmother   . Stroke Maternal Grandmother   . Heart disease Maternal Grandfather   . Cancer Paternal Grandfather   . Diabetes Brother     Social History Social History   Tobacco Use  . Smoking status: Former Smoker    Packs/day: 1.00    Types: Cigarettes  . Smokeless tobacco: Former NeurosurgeonUser  . Tobacco comment: quit 5 months ago  Substance Use Topics  . Alcohol use: No  . Drug use: No     Allergies   Patient has no known allergies.   Review of Systems Review of Systems  Neurological: Positive for dizziness.  All other systems reviewed and are negative.    Physical Exam Updated Vital Signs BP (!) 174/83   Pulse (!) 44   Temp (!) 97.4 F (36.3 C) (Oral)   Resp 19   SpO2 100%   Physical  Exam Vitals signs and nursing note reviewed.  Constitutional:      General: She is not in acute distress.    Appearance: She is well-developed. She is not diaphoretic.  HENT:     Head: Normocephalic and atraumatic.  Eyes:     Extraocular Movements: Extraocular movements intact.     Pupils: Pupils are equal, round, and reactive to light.  Neck:     Musculoskeletal: Normal range of motion and neck supple.  Cardiovascular:     Rate and Rhythm: Normal rate and regular rhythm.     Heart sounds: No murmur. No friction rub. No gallop.   Pulmonary:     Effort: Pulmonary effort is normal. No respiratory distress.     Breath sounds: Normal breath sounds. No wheezing.  Abdominal:     General: Bowel sounds are normal. There is no distension.     Palpations: Abdomen is soft.     Tenderness: There is no abdominal tenderness.  Musculoskeletal: Normal range of motion.  Skin:    General: Skin is warm and dry.  Neurological:     General: No focal deficit present.     Mental Status: She is alert and oriented to person, place, and time.     Cranial Nerves: No cranial nerve deficit.     Sensory: No sensory deficit.     Motor: No weakness.     Coordination: Coordination normal.      ED Treatments / Results  Labs (all labs ordered are listed, but only abnormal results are displayed) Labs Reviewed  CBC  BASIC METABOLIC PANEL  URINALYSIS, ROUTINE W REFLEX MICROSCOPIC  CBG MONITORING, ED    EKG None  Radiology No results found.  Procedures Procedures (including critical care time)  Medications Ordered in ED Medications  sodium chloride 0.9 % bolus 1,000 mL (has no administration in time range)  LORazepam (ATIVAN) injection 1 mg (has no administration in time range)  ondansetron (ZOFRAN) injection 4 mg (has no administration in time range)  meclizine (ANTIVERT) tablet 25 mg (has no administration in time range)     Initial Impression / Assessment and Plan / ED Course  I have  reviewed the triage vital signs and the nursing notes.  Pertinent labs & imaging results that were available during my care of the patient were reviewed by me and considered in my medical decision making (see chart for details).  Patient presenting with complaints of dizziness.  Her presentation is most consistent with a peripheral vertigo, however she is not feeling any better after receiving IV fluids, Zofran, Ativan, and meclizine.  Head CT is negative and  laboratory studies are unremarkable.  Patient will undergo an MRI to rule out stroke in the posterior circulation.  This study is pending.  Care will be signed out to the oncoming provider at shift change.  Final Clinical Impressions(s) / ED Diagnoses   Final diagnoses:  None    ED Discharge Orders    None       Geoffery Lyonselo, Jerimey Burridge, MD 08/15/18 2208

## 2018-08-15 NOTE — ED Triage Notes (Signed)
EMS states pt went outside and come back in, near syncope with vomiting. Has history of Vertigo and this episode is similar Zofran given. # 22 L AC 100 ml NS CBG 149 158/92-60

## 2018-08-16 MED ORDER — MECLIZINE HCL 25 MG PO TABS: 25.0000 mg | ORAL_TABLET | Freq: Three times a day (TID) | ORAL | 0 refills | Status: DC | PRN

## 2018-08-16 MED ORDER — ONDANSETRON 4 MG PO TBDP: 4.0000 mg | ORAL_TABLET | Freq: Three times a day (TID) | ORAL | 0 refills | Status: DC | PRN

## 2018-08-16 NOTE — Discharge Instructions (Signed)
Take the prescribed medication as directed-- sent to pharmacy already.  Try to make sure and rest and drink lots of fluids. Follow-up with your primary care doctor.  May also need to follow-up with ENT if ongoing issues with vertigo. Return to the ED for new or worsening symptoms.

## 2018-08-16 NOTE — ED Notes (Signed)
Attempted to ambulate patient, but patient unsteady. Patient started to rock back and forth once in a standing position. Assisted patient back to bed and placed side rails up. Will continue to monitor patient.

## 2018-08-18 ENCOUNTER — Other Ambulatory Visit: Payer: Self-pay

## 2018-08-18 ENCOUNTER — Ambulatory Visit (INDEPENDENT_AMBULATORY_CARE_PROVIDER_SITE_OTHER): Payer: Federal, State, Local not specified - PPO | Admitting: Internal Medicine

## 2018-08-18 ENCOUNTER — Encounter: Payer: Self-pay | Admitting: Internal Medicine

## 2018-08-18 DIAGNOSIS — R42 Dizziness and giddiness: Secondary | ICD-10-CM | POA: Diagnosis not present

## 2018-08-18 NOTE — Patient Instructions (Signed)
Take the meclizine.     Referral ordered for ENT and PT.

## 2018-08-18 NOTE — Progress Notes (Signed)
Subjective:    Patient ID: Nicole Wilcox, female    DOB: 29-Oct-1960, 58 y.o.   MRN: [id number]  HPI The patient is here for an acute visit.   Vertigo:  She went to the ED 7/19. She had sudden onset of vertigo, spinning sensation, that morning while at home.  She denied nausea.  She had constant spinning for > two hours.  Her symptoms worsened and she was unable to stand or walk. Her hands and feel felt like they were getting electrocuted.  She does not remember the paramedics - she was completely out of it.  She had water in the ED and that is when she was nauseous and vomited x 1 then.  Prior to that she was not nauseous. Her exam was unremarkable except for elevated BP.    She received IV ativan, zofran and meclizine.  Labs, EKG were unremarkable.  Ct scan unremarkable - she was still symptomatic so MRI done, which was also unremarkable. She started to feel better and was discharged home on meclizine TID.  She took 1/2 this morning.  She took 1/2 this afternoon.  She feels unstable and when the meclizine wears off she feels spinning.  Her vision is out of focus.  Head movements make it worse.     She denies a history of migraines.  She does not see an ENT.     She feels very unable and has difficulty walking.    Medications and allergies reviewed with patient and updated if appropriate.  Patient Active Problem List   Diagnosis Date Noted  . Acute pain of left knee 03/04/2018  . Mid back pain 11/05/2017  . 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 Pain following surgery or procedure 02/04/2016  . Anxiety and depression 08/22/2014  . Chronic left hip pain 02/28/2011  . Obesity 02/28/2011  . Health care maintenance 02/28/2011    Current Outpatient Medications on File Prior to Visit  Medication Sig Dispense Refill  . acetaminophen (TYLENOL) 650 MG CR tablet Take 650 mg by mouth every 8 (eight) hours as needed for pain.    Marland Kitchen b complex vitamins tablet Take 1 tablet by mouth  daily.    . cholecalciferol (VITAMIN D) 400 UNITS TABS Take by mouth daily.    . Chromium Picolinate 500 MCG TABS Take by mouth daily.    . Diclofenac Sodium (PENNSAID) 2 % SOLN Place 1 application onto the skin 2 (two) times daily. 1 Bottle 3  . DULoxetine (CYMBALTA) 30 MG capsule TAKE 1 CAPSULE(30 MG) BY MOUTH DAILY (Patient taking differently: Take 30 mg by mouth daily. ) 90 capsule 1  . meclizine (ANTIVERT) 25 MG tablet Take 1 tablet (25 mg total) by mouth 3 (three) times daily as needed for dizziness. 30 tablet 0  . Multiple Vitamin (MULTIVITAMIN) tablet Take 1 tablet by mouth daily.    . ondansetron (ZOFRAN ODT) 4 MG disintegrating tablet Take 1 tablet (4 mg total) by mouth every 8 (eight) hours as needed for nausea. 10 tablet 0  . Potassium 99 MG TABS Take by mouth.     No current facility-administered medications on file prior to visit.     Past Medical History:  Diagnosis Date  . Arthritis   . Bradycardia     Past Surgical History:  Procedure Laterality Date  . ABDOMINAL HYSTERECTOMY    . CHOLECYSTECTOMY    . GALLBLADDER SURGERY    . HERNIA REPAIR    . HIP SURGERY  S/P 3 separate hip surgeries in 2004  . INGUINAL HERNIA REPAIR    . TOTAL VAGINAL HYSTERECTOMY      Social History   Socioeconomic History  . Marital status: Married    Spouse name: Not on file  . Number of children: Not on file  . Years of education: Not on file  . Highest education level: Not on file  Occupational History  . Occupation: retired    Associate Professormployer: [employer]  Social Needs  . Financial resource strain: Not on file  . Food insecurity    Worry: Not on file    Inability: Not on file  . Transportation needs    Medical: Not on file    Non-medical: Not on file  Tobacco Use  . Smoking status: Former Smoker    Packs/day: 1.00    Types: Cigarettes  . Smokeless tobacco: Former NeurosurgeonUser  . Tobacco comment: quit 5 months ago  Substance and Sexual Activity  . Alcohol use: No  . Drug use:  No  . Sexual activity: Yes    Partners: Male  Lifestyle  . Physical activity    Days per week: Not on file    Minutes per session: Not on file  . Stress: Not on file  Relationships  . Social Musicianconnections    Talks on phone: Not on file    Gets together: Not on file    Attends religious service: Not on file    Active member of club or organization: Not on file    Attends meetings of clubs or organizations: Not on file    Relationship status: Not on file  Other Topics Concern  . Not on file  Social History Narrative   Exercises alittle.    Family History  Problem Relation Age of Onset  . Heart failure Mother   . Asthma Mother   . Cancer Father   . Diabetes Maternal Grandmother   . Hypertension Maternal Grandmother   . Stroke Maternal Grandmother   . Heart disease Maternal Grandfather   . Cancer Paternal Grandfather   . Diabetes Brother     Review of Systems  Constitutional: Negative for chills and fever.  HENT: Positive for ear pain (twice last night). Negative for sore throat.   Respiratory: Negative for cough, shortness of breath and wheezing.   Neurological: Positive for dizziness. Negative for headaches.       Objective:   Vitals:   08/18/18 1427  BP: (!) 162/96  Pulse: (!) 50  Resp: 16  Temp: 98.1 F (36.7 C)  SpO2: 98%   BP Readings from Last 3 Encounters:  08/18/18 (!) 162/96  08/16/18 (!) 150/93  06/28/18 130/86   Wt Readings from Last 3 Encounters:  08/18/18 218 lb (98.9 kg)  06/28/18 219 lb (99.3 kg)  03/03/18 217 lb (98.4 kg)   Body mass index is 35.19 kg/m.   Physical Exam Constitutional:      General: She is not in acute distress.    Appearance: Normal appearance. She is not ill-appearing.  HENT:     Head: Normocephalic and atraumatic.     Right Ear: Tympanic membrane, ear canal and external ear normal.     Left Ear: Tympanic membrane, ear canal and external ear normal.     Nose: Nose normal.     Mouth/Throat:     Mouth: Mucous  membranes are moist.     Pharynx: No posterior oropharyngeal erythema.  Eyes:     Comments: Positive nystagmus  Neck:     Musculoskeletal: Neck supple. No muscular tenderness.  Lymphadenopathy:     Cervical: No cervical adenopathy.  Skin:    General: Skin is warm and dry.  Neurological:     General: No focal deficit present.     Mental Status: She is alert.     Sensory: No sensory deficit.     Motor: No weakness.     Gait: Gait abnormal.          Lab Results  Component Value Date   WBC 6.7 08/15/2018   HGB 13.2 08/15/2018   HCT 41.6 08/15/2018   PLT 202 08/15/2018   GLUCOSE 120 (H) 08/15/2018   CHOL 203 (H) 06/28/2018   TRIG 96.0 06/28/2018   HDL 78.20 06/28/2018   LDLCALC 106 (H) 06/28/2018   ALT 15 06/28/2018   AST 23 06/28/2018   NA 139 08/15/2018   K 3.6 08/15/2018   CL 104 08/15/2018   CREATININE 0.76 08/15/2018   BUN 16 08/15/2018   CO2 21 (L) 08/15/2018   TSH 2.03 06/28/2018   INR 1.05 03/11/2013   HGBA1C 5.4 06/28/2018   MR BRAIN WO CONTRAST CLINICAL DATA:  Dizziness, vomiting, and altered mental status today.  EXAM: MRI HEAD WITHOUT CONTRAST  TECHNIQUE: Multiplanar, multiecho pulse sequences of the brain and surrounding structures were obtained without intravenous contrast.  COMPARISON:  Head CT 08/15/2018  FINDINGS: The study is intermittently moderately motion degraded.  Brain: There is no evidence of acute infarct, intracranial hemorrhage, mass, midline shift, or extra-axial fluid collection. The ventricles and sulci are within normal limits for age. Scattered cerebral white matter T2 hyperintensities are nonspecific but compatible with mildly age advanced chronic small vessel ischemic disease. A dilated perivascular space is incidentally noted in the anterior limb of the right internal capsule. A nonenlarged partially empty sella is noted.  Vascular: Major intracranial vascular flow voids are preserved.  Skull and upper cervical  spine: Unremarkable bone marrow signal.  Sinuses/Orbits: Unremarkable orbits. Paranasal sinuses and mastoid air cells are clear.  Other: None.  IMPRESSION: 1. No acute intracranial abnormality. 2. Mild chronic small vessel ischemic disease.  Electronically Signed   By: Sebastian AcheAllen  Grady M.D.   On: 08/15/2018 22:30 CT Head Wo Contrast CLINICAL DATA:  Altered mental status.  EXAM: CT HEAD WITHOUT CONTRAST  TECHNIQUE: Contiguous axial images were obtained from the base of the skull through the vertex without intravenous contrast.  COMPARISON:  None.  FINDINGS: Brain: Ventricles, cisterns and other CSF spaces are normal. There is no mass, mass effect, shift of midline structures or acute hemorrhage. No evidence of acute infarction.  Vascular: No hyperdense vessel or unexpected calcification.  Skull: Normal. Negative for fracture or focal lesion.  Sinuses/Orbits: No acute finding.  Other: None.  IMPRESSION: No acute findings.  Electronically Signed   By: Elberta Fortisaniel  Boyle M.D.   On: 08/15/2018 18:41   Assessment & Plan:    See Problem List for Assessment and Plan of chronic medical problems.

## 2018-08-18 NOTE — Assessment & Plan Note (Addendum)
Severe in nature, likely BPPV Meclizine TID  Referral ordered for ENT, PT

## 2018-12-16 ENCOUNTER — Encounter: Payer: Self-pay | Admitting: Internal Medicine

## 2018-12-17 MED ORDER — NALTREXONE-BUPROPION HCL ER 8-90 MG PO TB12: ORAL_TABLET | ORAL | 0 refills | Status: DC

## 2019-01-14 ENCOUNTER — Other Ambulatory Visit: Payer: Self-pay | Admitting: Internal Medicine

## 2019-02-19 ENCOUNTER — Encounter: Payer: Self-pay | Admitting: Internal Medicine

## 2019-02-21 MED ORDER — NALTREXONE-BUPROPION HCL ER 8-90 MG PO TB12: 1.0000 | ORAL_TABLET | Freq: Two times a day (BID) | ORAL | 3 refills | Status: DC

## 2019-07-04 ENCOUNTER — Other Ambulatory Visit: Payer: Self-pay

## 2019-07-04 ENCOUNTER — Ambulatory Visit (INDEPENDENT_AMBULATORY_CARE_PROVIDER_SITE_OTHER): Payer: Federal, State, Local not specified - PPO | Admitting: Internal Medicine

## 2019-07-04 ENCOUNTER — Encounter: Payer: Self-pay | Admitting: Internal Medicine

## 2019-07-04 VITALS — BP 138/74 | HR 59 | Temp 98.3°F | Ht 66.0 in | Wt 208.0 lb

## 2019-07-04 DIAGNOSIS — Z0000 Encounter for general adult medical examination without abnormal findings: Secondary | ICD-10-CM

## 2019-07-04 DIAGNOSIS — Z6833 Body mass index (BMI) 33.0-33.9, adult: Secondary | ICD-10-CM | POA: Diagnosis not present

## 2019-07-04 DIAGNOSIS — F419 Anxiety disorder, unspecified: Secondary | ICD-10-CM | POA: Diagnosis not present

## 2019-07-04 DIAGNOSIS — E6609 Other obesity due to excess calories: Secondary | ICD-10-CM

## 2019-07-04 DIAGNOSIS — F329 Major depressive disorder, single episode, unspecified: Secondary | ICD-10-CM

## 2019-07-04 LAB — COMPREHENSIVE METABOLIC PANEL
ALT: 18 U/L (ref 0–35)
AST: 26 U/L (ref 0–37)
Albumin: 4.3 g/dL (ref 3.5–5.2)
Alkaline Phosphatase: 57 U/L (ref 39–117)
BUN: 15 mg/dL (ref 6–23)
CO2: 30 mEq/L (ref 19–32)
Calcium: 9.5 mg/dL (ref 8.4–10.5)
Chloride: 105 mEq/L (ref 96–112)
Creatinine, Ser: 0.78 mg/dL (ref 0.40–1.20)
GFR: 75.51 mL/min (ref >60.00)
Glucose, Bld: 87 mg/dL (ref 70–99)
Potassium: 4.4 mEq/L (ref 3.5–5.1)
Sodium: 139 mEq/L (ref 135–145)
Total Bilirubin: 0.5 mg/dL (ref 0.2–1.2)
Total Protein: 6.8 g/dL (ref 6.0–8.3)

## 2019-07-04 LAB — CBC
HCT: 40.5 % (ref 36.0–46.0)
Hemoglobin: 13.3 g/dL (ref 12.0–15.0)
MCHC: 32.9 g/dL (ref 30.0–36.0)
MCV: 94.5 fl (ref 78.0–100.0)
Platelets: 212 10*3/uL (ref 150.0–400.0)
RBC: 4.28 Mil/uL (ref 3.87–5.11)
RDW: 13.4 % (ref 11.5–15.5)
WBC: 4.4 10*3/uL (ref 4.0–10.5)

## 2019-07-04 LAB — LIPID PANEL
Cholesterol: 221 mg/dL — ABNORMAL HIGH (ref 0–200)
HDL: 77.2 mg/dL (ref >39.00)
LDL Cholesterol: 127 mg/dL — ABNORMAL HIGH (ref 0–99)
NonHDL: 144.29
Total CHOL/HDL Ratio: 3
Triglycerides: 88 mg/dL (ref 0.0–149.0)
VLDL: 17.6 mg/dL (ref 0.0–40.0)

## 2019-07-04 LAB — TSH: TSH: 1.56 u[IU]/mL (ref 0.35–4.50)

## 2019-07-04 MED ORDER — DULOXETINE HCL 30 MG PO CPEP: ORAL_CAPSULE | ORAL | 3 refills | Status: DC

## 2019-07-04 NOTE — Progress Notes (Signed)
   Subjective:   Patient ID: Nicole Wilcox, female    DOB: 1960-11-04, 59 y.o.   MRN: [id number]  HPI The patient is a 59 YO female coming in for physical.   PMH, FMH, social history reviewed and updated  Review of Systems  Constitutional: Negative.   HENT: Negative.   Eyes: Negative.   Respiratory: Negative for cough, chest tightness and shortness of breath.   Cardiovascular: Negative for chest pain, palpitations and leg swelling.  Gastrointestinal: Negative for abdominal distention, abdominal pain, constipation, diarrhea, nausea and vomiting.  Musculoskeletal: Positive for arthralgias.  Skin: Negative.   Neurological: Negative.   Psychiatric/Behavioral: Negative.     Objective:  Physical Exam Constitutional:      Appearance: She is well-developed.  HENT:     Head: Normocephalic and atraumatic.  Cardiovascular:     Rate and Rhythm: Normal rate and regular rhythm.  Pulmonary:     Effort: Pulmonary effort is normal. No respiratory distress.     Breath sounds: Normal breath sounds. No wheezing or rales.  Abdominal:     General: Bowel sounds are normal. There is no distension.     Palpations: Abdomen is soft.     Tenderness: There is no abdominal tenderness. There is no rebound.  Musculoskeletal:        General: Tenderness present.     Cervical back: Normal range of motion.  Skin:    General: Skin is warm and dry.  Neurological:     Mental Status: She is alert and oriented to person, place, and time.     Coordination: Coordination normal.     Vitals:   07/04/19 1004  BP: 138/74  Pulse: (!) 59  Temp: 98.3 F (36.8 C)  TempSrc: Oral  SpO2: 98%  Weight: 208 lb (94.3 kg)  Height: 5\' 6"  (1.676 m)    This visit occurred during the SARS-CoV-2 public health emergency.  Safety protocols were in place, including screening questions prior to the visit, additional usage of staff PPE, and extensive cleaning of exam room while observing appropriate contact time as indicated  for disinfecting solutions.   Assessment & Plan:

## 2019-07-04 NOTE — Assessment & Plan Note (Signed)
Weight down about 11 pounds since last year.

## 2019-07-04 NOTE — Assessment & Plan Note (Signed)
Doing well on cymbalta 30 mg daily. Continue.

## 2019-07-04 NOTE — Patient Instructions (Signed)
Health Maintenance, Female Adopting a healthy lifestyle and getting preventive care are important in promoting health and wellness. Ask your health care provider about:  The right schedule for you to have regular tests and exams.  Things you can do on your own to prevent diseases and keep yourself healthy. What should I know about diet, weight, and exercise? Eat a healthy diet   Eat a diet that includes plenty of vegetables, fruits, low-fat dairy products, and lean protein.  Do not eat a lot of foods that are high in solid fats, added sugars, or sodium. Maintain a healthy weight Body mass index (BMI) is used to identify weight problems. It estimates body fat based on height and weight. Your health care provider can help determine your BMI and help you achieve or maintain a healthy weight. Get regular exercise Get regular exercise. This is one of the most important things you can do for your health. Most adults should:  Exercise for at least 150 minutes each week. The exercise should increase your heart rate and make you sweat (moderate-intensity exercise).  Do strengthening exercises at least twice a week. This is in addition to the moderate-intensity exercise.  Spend less time sitting. Even light physical activity can be beneficial. Watch cholesterol and blood lipids Have your blood tested for lipids and cholesterol at 59 years of age, then have this test every 5 years. Have your cholesterol levels checked more often if:  Your lipid or cholesterol levels are high.  You are older than 59 years of age.  You are at high risk for heart disease. What should I know about cancer screening? Depending on your health history and family history, you may need to have cancer screening at various ages. This may include screening for:  Breast cancer.  Cervical cancer.  Colorectal cancer.  Skin cancer.  Lung cancer. What should I know about heart disease, diabetes, and high blood  pressure? Blood pressure and heart disease  High blood pressure causes heart disease and increases the risk of stroke. This is more likely to develop in people who have high blood pressure readings, are of African descent, or are overweight.  Have your blood pressure checked: ? Every 3-5 years if you are 18-39 years of age. ? Every year if you are 40 years old or older. Diabetes Have regular diabetes screenings. This checks your fasting blood sugar level. Have the screening done:  Once every three years after age 40 if you are at a normal weight and have a low risk for diabetes.  More often and at a younger age if you are overweight or have a high risk for diabetes. What should I know about preventing infection? Hepatitis B If you have a higher risk for hepatitis B, you should be screened for this virus. Talk with your health care provider to find out if you are at risk for hepatitis B infection. Hepatitis C Testing is recommended for:  Everyone born from 1945 through 1965.  Anyone with known risk factors for hepatitis C. Sexually transmitted infections (STIs)  Get screened for STIs, including gonorrhea and chlamydia, if: ? You are sexually active and are younger than 59 years of age. ? You are older than 59 years of age and your health care provider tells you that you are at risk for this type of infection. ? Your sexual activity has changed since you were last screened, and you are at increased risk for chlamydia or gonorrhea. Ask your health care provider if   you are at risk.  Ask your health care provider about whether you are at high risk for HIV. Your health care provider may recommend a prescription medicine to help prevent HIV infection. If you choose to take medicine to prevent HIV, you should first get tested for HIV. You should then be tested every 3 months for as long as you are taking the medicine. Pregnancy  If you are about to stop having your period (premenopausal) and  you may become pregnant, seek counseling before you get pregnant.  Take 400 to 800 micrograms (mcg) of folic acid every day if you become pregnant.  Ask for birth control (contraception) if you want to prevent pregnancy. Osteoporosis and menopause Osteoporosis is a disease in which the bones lose minerals and strength with aging. This can result in bone fractures. If you are 65 years old or older, or if you are at risk for osteoporosis and fractures, ask your health care provider if you should:  Be screened for bone loss.  Take a calcium or vitamin D supplement to lower your risk of fractures.  Be given hormone replacement therapy (HRT) to treat symptoms of menopause. Follow these instructions at home: Lifestyle  Do not use any products that contain nicotine or tobacco, such as cigarettes, e-cigarettes, and chewing tobacco. If you need help quitting, ask your health care provider.  Do not use street drugs.  Do not share needles.  Ask your health care provider for help if you need support or information about quitting drugs. Alcohol use  Do not drink alcohol if: ? Your health care provider tells you not to drink. ? You are pregnant, may be pregnant, or are planning to become pregnant.  If you drink alcohol: ? Limit how much you use to 0-1 drink a day. ? Limit intake if you are breastfeeding.  Be aware of how much alcohol is in your drink. In the [place] one drink equals one 12 oz bottle of beer (355 mL), one 5 oz glass of wine (148 mL), or one 1 oz glass of hard liquor (44 mL). General instructions  Schedule regular health, dental, and eye exams.  Stay current with your vaccines.  Tell your health care provider if: ? You often feel depressed. ? You have ever been abused or do not feel safe at home. Summary  Adopting a healthy lifestyle and getting preventive care are important in promoting health and wellness.  Follow your health care provider's instructions about healthy  diet, exercising, and getting tested or screened for diseases.  Follow your health care provider's instructions on monitoring your cholesterol and blood pressure. This information is not intended to replace advice given to you by your health care provider. Make sure you discuss any questions you have with your health care provider. Document Revised: 01/06/2018 Document Reviewed: 01/06/2018 Elsevier Patient Education  2020 [employer]  

## 2019-07-04 NOTE — Assessment & Plan Note (Signed)
Flu shot due next season, covid-19 complete. Shingrix counseled. Tetanus up to date. Colonoscopy up to date. Mammogram up to date, pap smear up to date. Counseled about sun safety and mole surveillance. Counseled about the dangers of distracted driving. Given 10 year screening recommendations.

## 2019-09-20 ENCOUNTER — Telehealth: Payer: Self-pay

## 2019-09-20 DIAGNOSIS — R42 Dizziness and giddiness: Secondary | ICD-10-CM

## 2019-09-20 NOTE — Telephone Encounter (Signed)
New message   Need a referral to ENT on [place] reason is vertigo

## 2019-09-20 NOTE — Addendum Note (Signed)
Addended by: Pincus Sanes on: 09/20/2019 04:54 PM   Modules accepted: Orders

## 2019-12-01 IMAGING — CT CT HEAD WITHOUT CONTRAST
3 series · 15 of 47 positions shown, 18 images · non-contrast
Comparison: None.

CLINICAL DATA: Altered mental status.

EXAM:
CT HEAD WITHOUT CONTRAST
TECHNIQUE: Contiguous axial images were obtained from the base of the skull
through the vertex without intravenous contrast.

[Series 2: head wo · axial · 0.47mm/px · z∈[-147,-22]mm · 9 of 30 slices shown, 12 images]
[im 3/30  brain]
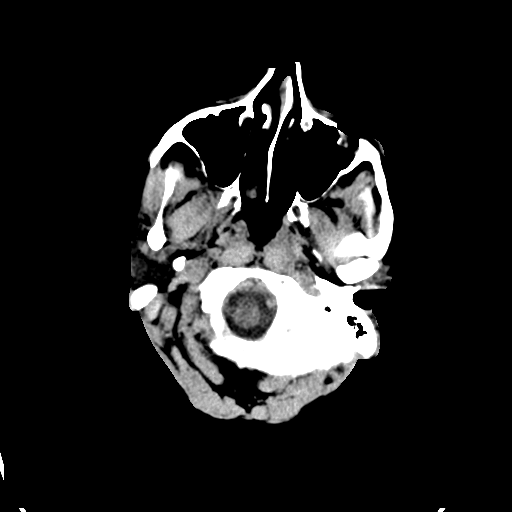
[im 3/30  bone]
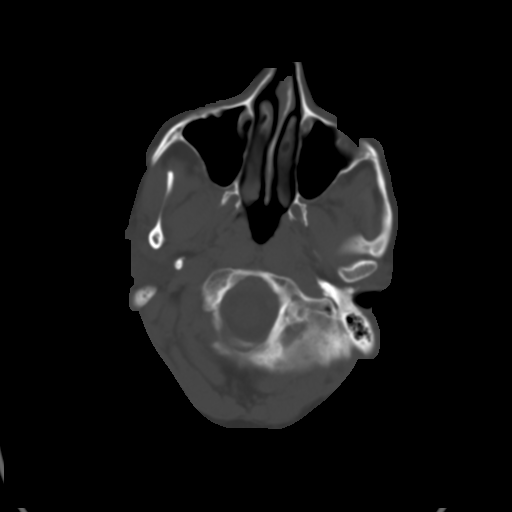
[im 6/30  brain]
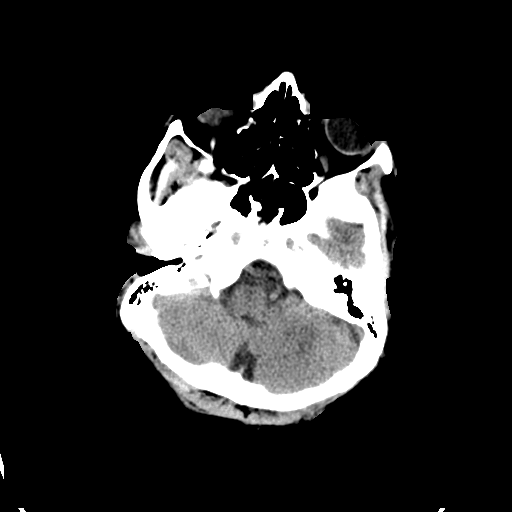
[im 9/30  brain]
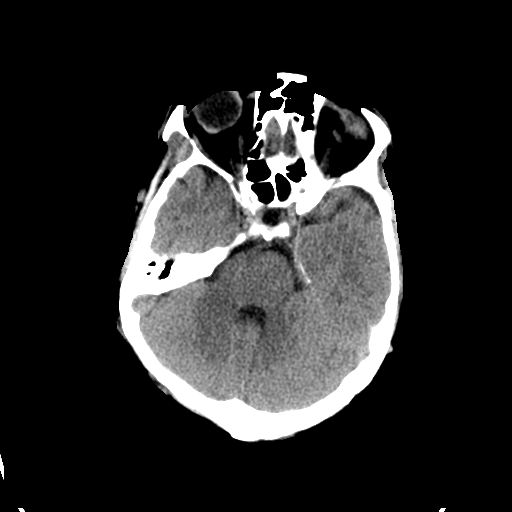
[im 12/30  brain]
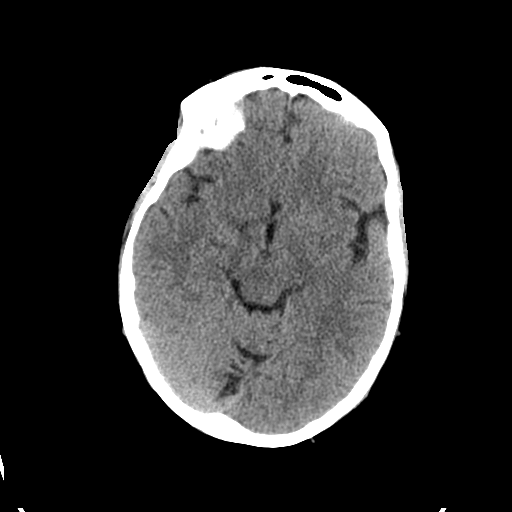
[im 16/30  brain]
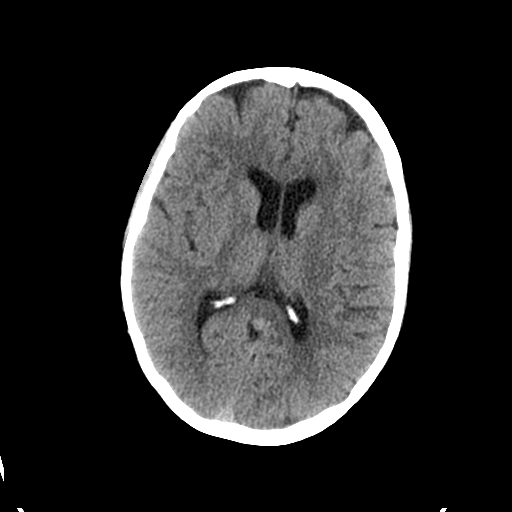
[im 16/30  bone]
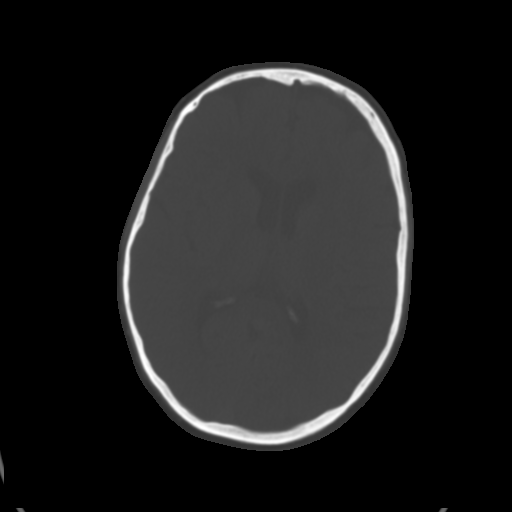
[im 19/30  brain]
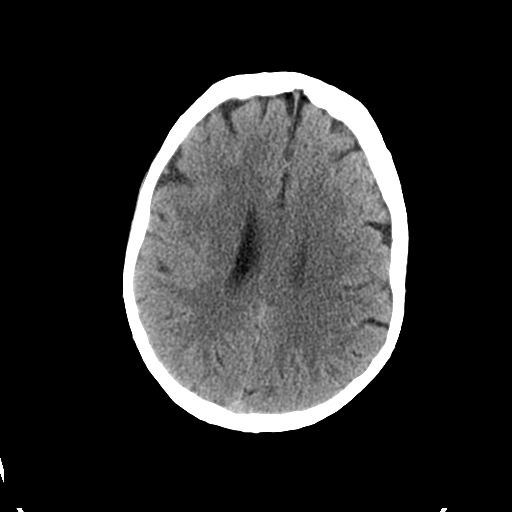
[im 22/30  brain]
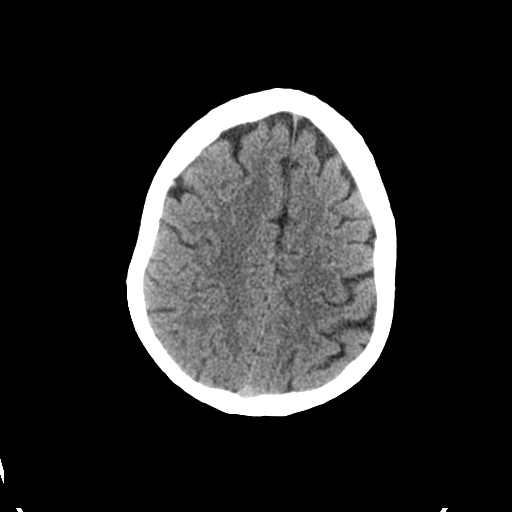
[im 25/30  brain]
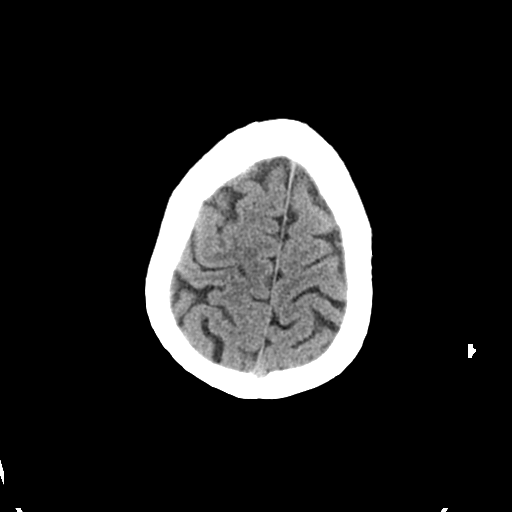
[im 28/30  brain]
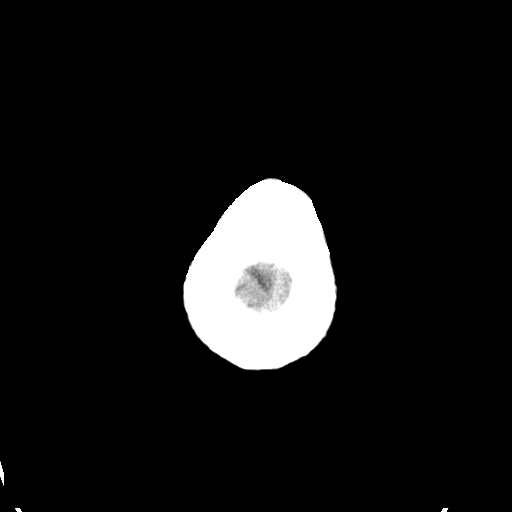
[im 28/30  bone]
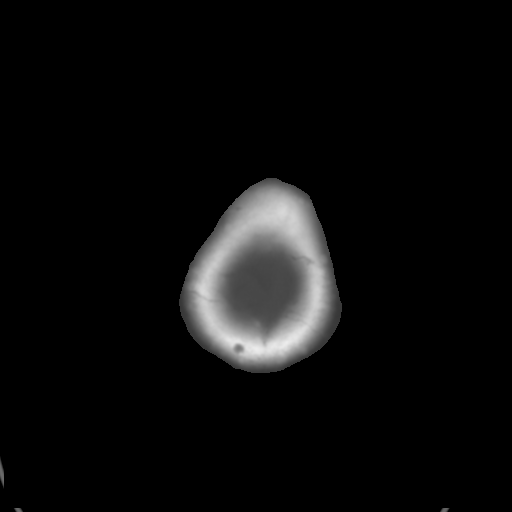

[Series 4: coronal soft tissue · coronal · 0.32mm/px · 3 of 61 slices shown]
[im 21/61  brain]
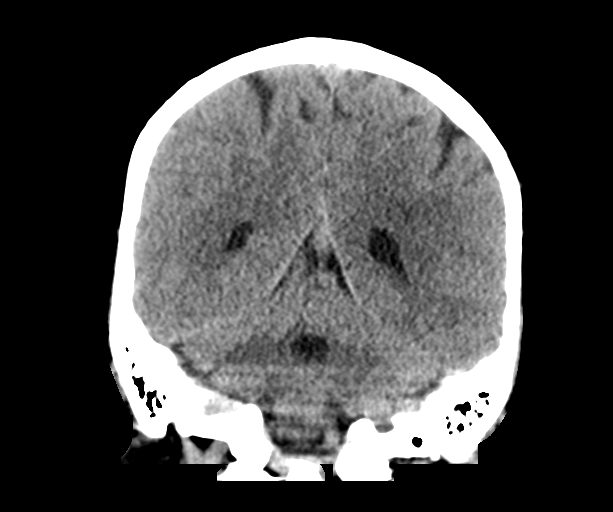
[im 27/61  brain]
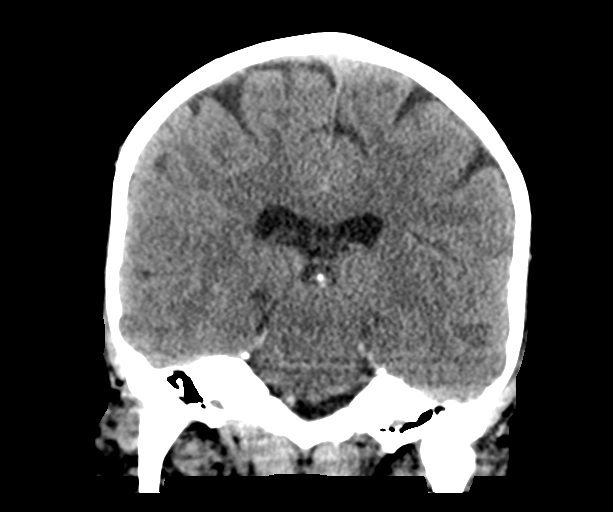
[im 34/61  brain]
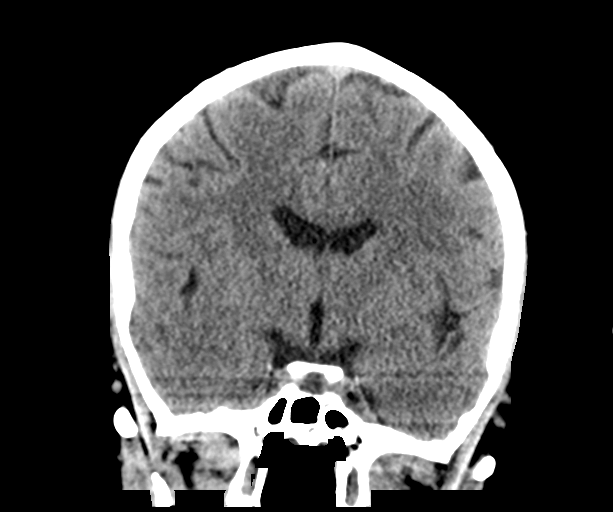

[Series 5: sagittal soft tissue · sagittal · 0.31mm/px · 3 of 47 slices shown]
[im 16/47  brain]
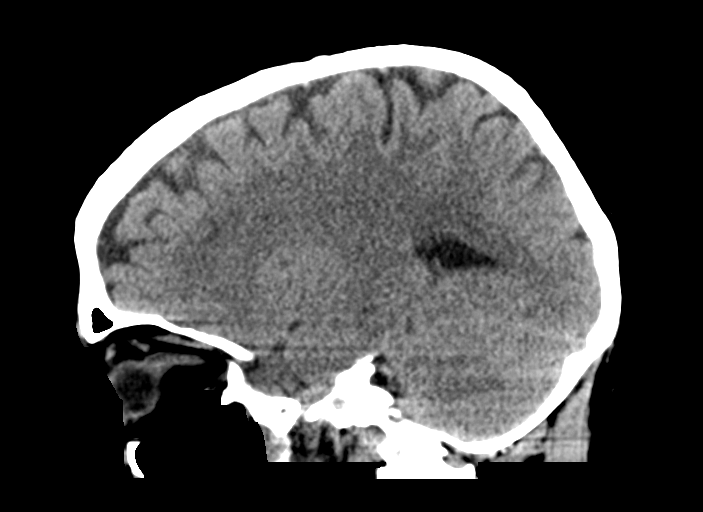
[im 24/47  brain]
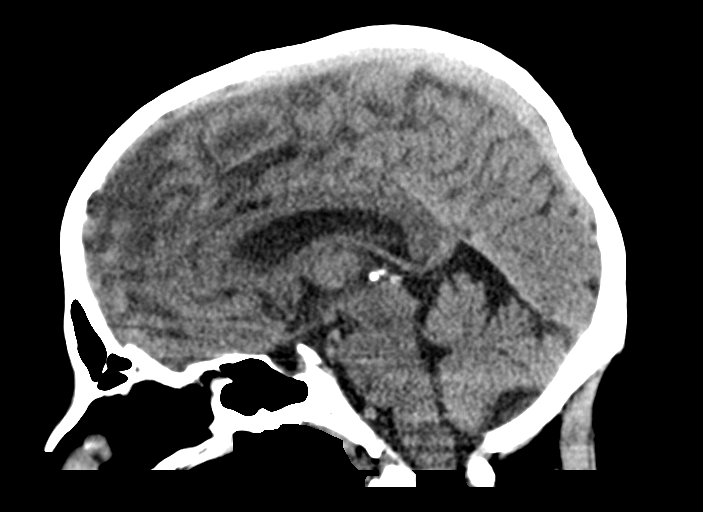
[im 31/47  brain]
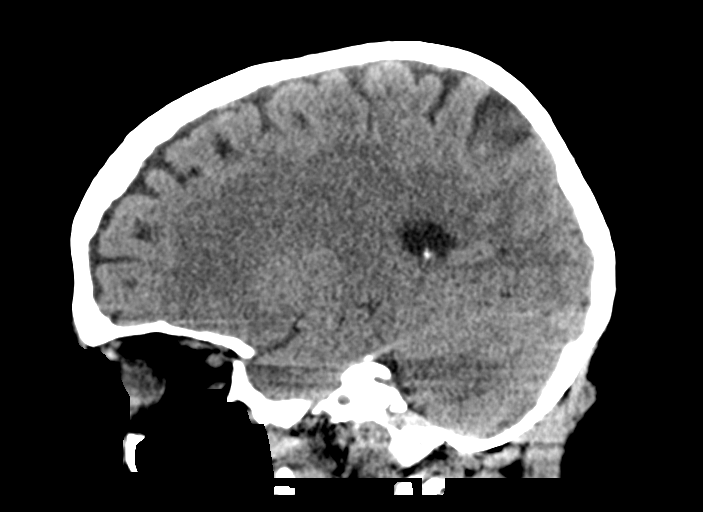

[15 of 47 positions shown; findings below may reference images not displayed]

FINDINGS: Brain: Ventricles, cisterns and other CSF spaces are normal. There
is no mass, mass effect, shift of midline structures or acute
hemorrhage. No evidence of acute infarction.

Vascular: No hyperdense vessel or unexpected calcification.

Skull: Normal. Negative for fracture or focal lesion.

Sinuses/Orbits: No acute finding.

Other: None.
IMPRESSION: No acute findings.

## 2020-02-14 ENCOUNTER — Encounter: Payer: Self-pay | Admitting: Internal Medicine

## 2020-02-15 ENCOUNTER — Ambulatory Visit (INDEPENDENT_AMBULATORY_CARE_PROVIDER_SITE_OTHER): Payer: Federal, State, Local not specified - PPO

## 2020-02-15 ENCOUNTER — Encounter: Payer: Self-pay | Admitting: Emergency Medicine

## 2020-02-15 ENCOUNTER — Other Ambulatory Visit: Payer: Self-pay

## 2020-02-15 ENCOUNTER — Ambulatory Visit
Admission: EM | Admit: 2020-02-15 | Discharge: 2020-02-15 | Disposition: A | Discharge status: Home / Self Care | Payer: Federal, State, Local not specified - PPO | Attending: Emergency Medicine | Admitting: Emergency Medicine

## 2020-02-15 DIAGNOSIS — M79672 Pain in left foot: Secondary | ICD-10-CM

## 2020-02-15 DIAGNOSIS — S92355A Nondisplaced fracture of fifth metatarsal bone, left foot, initial encounter for closed fracture: Secondary | ICD-10-CM | POA: Diagnosis not present

## 2020-02-15 NOTE — Discharge Instructions (Signed)
Please wear shoe and crutches Continue Tylenol/ibuprofen for pain and swelling Ice and elevate Follow-up with sports medicine for further monitoring of healing

## 2020-02-15 NOTE — ED Triage Notes (Signed)
Pt sts injured left foot avoiding fall on 1/2; pt sts bruising, pain and swelling noted since event that hasnt improved

## 2020-02-15 NOTE — ED Provider Notes (Signed)
[facility]    CSN: [id number] Arrival date & time: 02/15/20  1120      History   Chief Complaint Chief Complaint  Patient presents with  . Foot Pain    HPI Nicole Wilcox is a 60 y.o. female presenting today for evaluation of left foot injury.  Injured left foot coming down attic stairs and rolled foot on 1/2, approximately 2 weeks ago.  Since has had pain swelling and bruising especially notable along lateral aspect of foot.  Has significant pain with weightbearing.  HPI  Past Medical History:  Diagnosis Date  . Arthritis   . Bradycardia     Patient Active Problem List   Diagnosis Date Noted  . Vertigo 08/18/2018  . Acute pain of left knee 03/04/2018  . Mid back pain 11/05/2017  . Pain following surgery or procedure 02/04/2016  . Anxiety and depression 08/22/2014  . Chronic left hip pain 02/28/2011  . Obesity 02/28/2011  . Health care maintenance 02/28/2011    Past Surgical History:  Procedure Laterality Date  . ABDOMINAL HYSTERECTOMY    . CHOLECYSTECTOMY    . GALLBLADDER SURGERY    . HERNIA REPAIR    . HIP SURGERY     S/P 3 separate hip surgeries in 2004  . INGUINAL HERNIA REPAIR    . TOTAL VAGINAL HYSTERECTOMY      OB History   No obstetric history on file.      Home Medications    Prior to Admission medications   Medication Sig Start Date End Date Taking? Authorizing Provider  acetaminophen (TYLENOL) 650 MG CR tablet Take 650 mg by mouth every 8 (eight) hours as needed for pain.    [provider]  b complex vitamins tablet Take 1 tablet by mouth daily.    [provider]  cholecalciferol (VITAMIN D) 400 UNITS TABS Take by mouth daily.    [provider]  Chromium Picolinate 500 MCG TABS Take by mouth daily.    [provider]  DULoxetine (CYMBALTA) 30 MG capsule TAKE 1 CAPSULE(30 MG) BY MOUTH DAILY 07/04/19   Myrlene Broker, MD  Multiple Vitamin (MULTIVITAMIN) tablet Take 1 tablet by mouth  daily.    [provider]  Potassium 99 MG TABS Take by mouth.    [provider]    Family History Family History  Problem Relation Age of Onset  . Heart failure Mother   . Asthma Mother   . Cancer Father   . Diabetes Maternal Grandmother   . Hypertension Maternal Grandmother   . Stroke Maternal Grandmother   . Heart disease Maternal Grandfather   . Cancer Paternal Grandfather   . Diabetes Brother     Social History Social History   Tobacco Use  . Smoking status: Former Smoker    Packs/day: 1.00    Types: Cigarettes  . Smokeless tobacco: Former Neurosurgeon  . Tobacco comment: quit 5 months ago  Substance Use Topics  . Alcohol use: No  . Drug use: No     Allergies   Patient has no known allergies.   Review of Systems Review of Systems  Constitutional: Negative for fatigue and fever.  Eyes: Negative for visual disturbance.  Respiratory: Negative for shortness of breath.   Cardiovascular: Negative for chest pain.  Gastrointestinal: Negative for abdominal pain, nausea and vomiting.  Musculoskeletal: Positive for arthralgias, gait problem and joint swelling.  Skin: Positive for color change. Negative for rash and wound.  Neurological: Negative for dizziness, weakness,  light-headedness and headaches.     Physical Exam Triage Vital Signs ED Triage Vitals [02/15/20 1508]  Enc Vitals Group     BP (!) 175/94     Pulse Rate 76     Resp 18     Temp 98 F (36.7 C)     Temp Source Oral     SpO2 97 %     Weight      Height      Head Circumference      Peak Flow      Pain Score 7     Pain Loc      Pain Edu?      Excl. in GC?    No data found.  Updated Vital Signs BP (!) 175/94 (BP Location: Left Arm)   Pulse 76   Temp 98 F (36.7 C) (Oral)   Resp 18   SpO2 97%   Visual Acuity Right Eye Distance:   Left Eye Distance:   Bilateral Distance:    Right Eye Near:   Left Eye Near:    Bilateral Near:     Physical Exam Vitals and nursing  note reviewed.  Constitutional:      Appearance: She is well-developed and well-nourished.     Comments: No acute distress  HENT:     Head: Normocephalic and atraumatic.     Nose: Nose normal.  Eyes:     Conjunctiva/sclera: Conjunctivae normal.  Cardiovascular:     Rate and Rhythm: Normal rate.  Pulmonary:     Effort: Pulmonary effort is normal. No respiratory distress.  Abdominal:     General: There is no distension.  Musculoskeletal:        General: Normal range of motion.     Cervical back: Neck supple.     Comments: Left foot: Moderate swelling and tenderness noted along fifth metatarsal, mild bruising noted to the plantar surface of foot, nontender throughout first through third metatarsals, nontender at medial lateral malleolus Dorsalis pedis 2+  Skin:    General: Skin is warm and dry.  Neurological:     Mental Status: She is alert and oriented to person, place, and time.  Psychiatric:        Mood and Affect: Mood and affect normal.      UC Treatments / Results  Labs (all labs ordered are listed, but only abnormal results are displayed) Labs Reviewed - No data to display  EKG   Radiology DG Foot Complete Left  Result Date: 02/15/2020 CLINICAL DATA:  Left foot injury 01/29/2020 with lateral left foot pain EXAM: LEFT FOOT - COMPLETE 3+ VIEW COMPARISON:  None. FINDINGS: Nondisplaced proximal metaphysis fracture in the left fifth metatarsal, non articular, with surrounding soft tissue swelling. No additional fracture. No dislocation. No suspicious focal osseous lesions. Mild-to-moderate hallux valgus deformity with mild first MTP joint osteoarthritis. Small Achilles left calcaneal spur. No radiopaque foreign body. IMPRESSION: 1. Nondisplaced nonarticular proximal metaphysis fracture in the left fifth metatarsal. 2. Mild-to-moderate hallux valgus deformity with mild first MTP joint osteoarthritis. Electronically Signed   By: Delbert Phenix M.D.   On: 02/15/2020 15:33     Procedures Procedures (including critical care time)  Medications Ordered in UC Medications - No data to display  Initial Impression / Assessment and Plan / UC Course  I have reviewed the triage vital signs and the nursing notes.  Pertinent labs & imaging results that were available during my care of the patient were reviewed by me and considered in my  medical decision making (see chart for details).     Proximal fifth metatarsal fracture, placing in postop shoe, crutches and nonweightbearing, follow-up with sports medicine, anti-inflammatories for pain.  Discussed strict return precautions. Patient verbalized understanding and is agreeable with plan.  Final Clinical Impressions(s) / UC Diagnoses   Final diagnoses:  Closed nondisplaced fracture of fifth metatarsal bone of left foot, initial encounter     Discharge Instructions     Please wear shoe and crutches Continue Tylenol/ibuprofen for pain and swelling Ice and elevate Follow-up with sports medicine for further monitoring of healing    ED Prescriptions    None     PDMP not reviewed this encounter.   Lew Dawes, [place] 02/15/20 1738

## 2020-02-22 NOTE — Progress Notes (Signed)
Subjective:    CC: L foot pain / L 5th MT fx  I, Molly Weber, LAT, ATC, am serving as scribe for Dr. Clementeen Graham.  HPI: Pt is a 60 y/o female presenting w/ c/o L lateral foot pain and confirmed L 5th MT fracture per [facility] after rolling her foot/ankle coming down her attic stairs approximately 3 weeks ago.  She was seen at the [facility] on 02/15/20 and was provided a post-op shoe and crutches and advised to be NWB.  Since then, pt reports that she con't to have pain along her L lateral foot/5th MT.  She locates her pain to her L lateral foot/5th MT.  She states that she con't to have swelling and bruising in her L foot.  She is no longer using her crutches due to having a lot of pain in her L hip which she had a prior L THR.  L foot swelling: yes Aggravating factors: weight-bearing/walking; pressure to the area Treatments tried: post-op shoe; RICE; IBU  Pertinent review of Systems: No fevers or chills  Relevant historical information: History left total hip replacement 2015.   Objective:    Vitals:   02/23/20 0902  BP: (!) 160/110  Pulse: (!) 54  SpO2: 96%   General: Well Developed, well nourished, and in no acute distress.   MSK: Left mildly tender palpation lateral midfoot. Antalgic gait.  Lab and Radiology Results  X-ray images left foot obtained February 15, 2020 personally and independently interpreted. Nondisplaced Jones fracture present.  Last vitamin D Lab Results  Component Value Date   VD25OH 42.50 02/04/2016     Chemistry      Component Value Date/Time   NA 139 07/04/2019 1046   K 4.4 07/04/2019 1046   CL 105 07/04/2019 1046   CO2 30 07/04/2019 1046   BUN 15 07/04/2019 1046   CREATININE 0.78 07/04/2019 1046   CREATININE 0.81 09/07/2015 1555      Component Value Date/Time   CALCIUM 9.5 07/04/2019 1046   ALKPHOS 57 07/04/2019 1046   AST 26 07/04/2019 1046   ALT 18 07/04/2019 1046   BILITOT 0.5 07/04/2019 1046         Impression and Recommendations:    Assessment and Plan: 60 y.o. female with left foot Jones fracture. Patient has had significant difficulty tolerating nonweightbearing for a very short period of time. I do not think that she is going to do very well with 3 months of nonweightbearing with a cast or cast boot.  I think she is a reasonable surgical candidate. She does not have significant medical conditions that would preclude surgery. Plan to refer to orthopedic surgery for evaluation surgical fixation. Because anticipate that she will be able to get in with surgery soon and and that surgery will need updated x-rays will not update x-rays myself today.  Additionally she is postmenopausal and has not had DEXA scan. We will go ahead and update DEXA scan to assess bone mineral density with this fracture. Vitamin D a few years ago was normal and calcium recently was normal.  PDMP not reviewed this encounter. Orders Placed This Encounter  Procedures  . DG Bone Density    Standing Status:   Future    Standing Expiration Date:   02/22/2021    Order Specific Question:   Reason for Exam (SYMPTOM  OR DIAGNOSIS REQUIRED)    Answer:   post menopausal    Order Specific Question:   Is the patient pregnant?  Answer:   No    Order Specific Question:   Preferred imaging location?    Answer:   [facility]  . Ambulatory referral to Orthopedic Surgery    Referral Priority:   Routine    Referral Type:   Surgical    Referral Reason:   Specialty Services Required    Requested Specialty:   Orthopedic Surgery    Number of Visits Requested:   1   No orders of the defined types were placed in this encounter.   Discussed warning signs or symptoms. Please see discharge instructions. Patient expresses understanding.   The above documentation has been reviewed and is accurate and complete Clementeen Graham, M.D.

## 2020-02-23 ENCOUNTER — Ambulatory Visit: Payer: Federal, State, Local not specified - PPO | Admitting: Family Medicine

## 2020-02-23 ENCOUNTER — Other Ambulatory Visit: Payer: Self-pay

## 2020-02-23 ENCOUNTER — Ambulatory Visit (INDEPENDENT_AMBULATORY_CARE_PROVIDER_SITE_OTHER)
Admission: RE | Admit: 2020-02-23 | Discharge: 2020-02-23 | Disposition: A | Discharge status: Home / Self Care | Payer: Federal, State, Local not specified - PPO | Source: Ambulatory Visit | Attending: Internal Medicine | Admitting: Internal Medicine

## 2020-02-23 ENCOUNTER — Encounter: Payer: Self-pay | Admitting: Family Medicine

## 2020-02-23 VITALS — BP 160/110 | HR 54 | Ht 66.0 in | Wt 208.0 lb

## 2020-02-23 DIAGNOSIS — Z780 Asymptomatic menopausal state: Secondary | ICD-10-CM

## 2020-02-23 DIAGNOSIS — S99192A Other physeal fracture of left metatarsal, initial encounter for closed fracture: Secondary | ICD-10-CM

## 2020-02-23 NOTE — Patient Instructions (Addendum)
Thank you for coming in today.  You should hear from orthopedic surgery soon.   I recommend that you get a long cam walker boot from [facility] medical supply as well as a knee scooter.      A walker is a good idea as well.   Try the keep the weight off the foot.   Let me know if you are having a problem with orthopedics.   Please go to [facility] supply to get the cam walker we talked about today. You may also be able to get it from [employer].   Please arrange for the dexa scan to assess bone mineral density at the front desk as you check out.

## 2020-02-26 DIAGNOSIS — S99192A Other physeal fracture of left metatarsal, initial encounter for closed fracture: Secondary | ICD-10-CM | POA: Diagnosis not present

## 2020-02-26 DIAGNOSIS — Z780 Asymptomatic menopausal state: Secondary | ICD-10-CM | POA: Diagnosis not present

## 2020-02-27 NOTE — Progress Notes (Signed)
Bone density is normal

## 2020-02-29 ENCOUNTER — Ambulatory Visit (INDEPENDENT_AMBULATORY_CARE_PROVIDER_SITE_OTHER): Payer: Federal, State, Local not specified - PPO

## 2020-02-29 ENCOUNTER — Encounter: Payer: Self-pay | Admitting: Orthopaedic Surgery

## 2020-02-29 ENCOUNTER — Ambulatory Visit: Payer: Federal, State, Local not specified - PPO | Admitting: Orthopaedic Surgery

## 2020-02-29 DIAGNOSIS — S92352A Displaced fracture of fifth metatarsal bone, left foot, initial encounter for closed fracture: Secondary | ICD-10-CM

## 2020-02-29 DIAGNOSIS — M79672 Pain in left foot: Secondary | ICD-10-CM

## 2020-02-29 NOTE — Progress Notes (Signed)
Office Visit Note   Patient: Nicole Wilcox           Date of Birth: 09/08/1960           MRN: [id number] Visit Date: 02/29/2020              Requested by: Rodolph Bong, MD [address] [place],  [place] [address] PCP: Myrlene Broker, MD   Assessment & Plan: Visit Diagnoses:  1. Left foot pain   2. Closed displaced fracture of fifth metatarsal bone of left foot, initial encounter     Plan: She does have a Jones fracture of her left fifth metatarsal.  This should do well with time.  She will continue to increase activities as comfort allows.  She'll avoid high impact aerobic activities.  I would like to see her back in 4 weeks for repeat 3 views of her left foot.  All questions and concerns were answered and addressed.  Follow-Up Instructions: Return in about 4 weeks (around 03/28/2020).   Orders:  Orders Placed This Encounter  Procedures  . XR Foot Complete Left   No orders of the defined types were placed in this encounter.     Procedures: No procedures performed   Clinical Data: No additional findings.   Subjective: Chief Complaint  Patient presents with  . Left Foot - Injury  The patient is referral for a left foot injury.  She actually fractured her left foot fifth metatarsal a month ago.  She is in a postoperative shoe and using crutches.  She is doing well.  She not been putting much pressure on her foot.  She is not diabetic and not a smoker.  She has not injured this side before.  This was an accidental fall and she does have a history of a left hip replacement that she is trying to protect when she fell injuring her left foot.  She denies any left foot numbness.  HPI  Review of Systems There is currently no listed chest pain, shortness of breath, fever, chills, nausea, vomiting  Objective: Vital Signs: There were no vitals taken for this visit.  Physical Exam She is alert and orient x3 and in no acute distress Ortho Exam  Examination of her  left foot shows some pain over the fifth metatarsal.  She does have a bunion deformity and some crossover of her first and second toes which she said is new.  Her foot is well-perfused and neurovascularly intact. Specialty Comments:  No specialty comments available.  Imaging: XR Foot Complete Left  Result Date: 02/29/2020 3 views of the left foot show 1/5 metatarsal fracture at the metaphyseal diaphyseal junction consistent with a Jones fracture.  There is actually been some slight interval healing.  The original fracture was 4 weeks ago.    PMFS History: Patient Active Problem List   Diagnosis Date Noted  . Vertigo 08/18/2018  . Acute pain of left knee 03/04/2018  . Mid back pain 11/05/2017  . Pain following surgery or procedure 02/04/2016  . Anxiety and depression 08/22/2014  . Chronic left hip pain 02/28/2011  . Obesity 02/28/2011  . Health care maintenance 02/28/2011   Past Medical History:  Diagnosis Date  . Arthritis   . Bradycardia     Family History  Problem Relation Age of Onset  . Heart failure Mother   . Asthma Mother   . Cancer Father   . Diabetes Maternal Grandmother   . Hypertension Maternal Grandmother   .  Stroke Maternal Grandmother   . Heart disease Maternal Grandfather   . Cancer Paternal Grandfather   . Diabetes Brother     Past Surgical History:  Procedure Laterality Date  . ABDOMINAL HYSTERECTOMY    . CHOLECYSTECTOMY    . GALLBLADDER SURGERY    . HERNIA REPAIR    . HIP SURGERY     S/P 3 separate hip surgeries in 2004  . INGUINAL HERNIA REPAIR    . TOTAL VAGINAL HYSTERECTOMY     Social History   Occupational History  . Occupation: retired    Associate Professor: [employer]  Tobacco Use  . Smoking status: Former Smoker    Packs/day: 1.00    Types: Cigarettes  . Smokeless tobacco: Former Neurosurgeon  . Tobacco comment: quit 5 months ago  Substance and Sexual Activity  . Alcohol use: No  . Drug use: No  . Sexual activity: Yes    Partners: Male

## 2020-07-05 ENCOUNTER — Ambulatory Visit (INDEPENDENT_AMBULATORY_CARE_PROVIDER_SITE_OTHER): Payer: Federal, State, Local not specified - PPO | Admitting: Internal Medicine

## 2020-07-05 ENCOUNTER — Encounter: Payer: Self-pay | Admitting: Internal Medicine

## 2020-07-05 ENCOUNTER — Other Ambulatory Visit: Payer: Self-pay

## 2020-07-05 VITALS — BP 124/80 | HR 55 | Temp 98.2°F | Resp 18 | Ht 66.0 in | Wt 195.2 lb

## 2020-07-05 DIAGNOSIS — F419 Anxiety disorder, unspecified: Secondary | ICD-10-CM

## 2020-07-05 DIAGNOSIS — F32A Depression, unspecified: Secondary | ICD-10-CM | POA: Diagnosis not present

## 2020-07-05 DIAGNOSIS — Z0000 Encounter for general adult medical examination without abnormal findings: Secondary | ICD-10-CM | POA: Diagnosis not present

## 2020-07-05 LAB — LIPID PANEL
Cholesterol: 208 mg/dL — ABNORMAL HIGH (ref 0–200)
HDL: 74 mg/dL (ref >39.00)
LDL Cholesterol: 117 mg/dL — ABNORMAL HIGH (ref 0–99)
NonHDL: 133.93
Total CHOL/HDL Ratio: 3
Triglycerides: 87 mg/dL (ref 0.0–149.0)
VLDL: 17.4 mg/dL (ref 0.0–40.0)

## 2020-07-05 LAB — COMPREHENSIVE METABOLIC PANEL
ALT: 16 U/L (ref 0–35)
AST: 23 U/L (ref 0–37)
Albumin: 4.4 g/dL (ref 3.5–5.2)
Alkaline Phosphatase: 52 U/L (ref 39–117)
BUN: 21 mg/dL (ref 6–23)
CO2: 29 mEq/L (ref 19–32)
Calcium: 9.5 mg/dL (ref 8.4–10.5)
Chloride: 105 mEq/L (ref 96–112)
Creatinine, Ser: 0.78 mg/dL (ref 0.40–1.20)
GFR: 82.61 mL/min (ref >60.00)
Glucose, Bld: 84 mg/dL (ref 70–99)
Potassium: 4.1 mEq/L (ref 3.5–5.1)
Sodium: 142 mEq/L (ref 135–145)
Total Bilirubin: 0.5 mg/dL (ref 0.2–1.2)
Total Protein: 7 g/dL (ref 6.0–8.3)

## 2020-07-05 LAB — CBC
HCT: 39.7 % (ref 36.0–46.0)
Hemoglobin: 13.3 g/dL (ref 12.0–15.0)
MCHC: 33.5 g/dL (ref 30.0–36.0)
MCV: 92.5 fl (ref 78.0–100.0)
Platelets: 196 10*3/uL (ref 150.0–400.0)
RBC: 4.29 Mil/uL (ref 3.87–5.11)
RDW: 13.3 % (ref 11.5–15.5)
WBC: 5.2 10*3/uL (ref 4.0–10.5)

## 2020-07-05 LAB — TSH: TSH: 3.53 u[IU]/mL (ref 0.35–4.50)

## 2020-07-05 MED ORDER — DULOXETINE HCL 30 MG PO CPEP: ORAL_CAPSULE | ORAL | 3 refills | Status: DC

## 2020-07-05 NOTE — Assessment & Plan Note (Signed)
Stopped cymbalta thinking she did not need any longer but is having moderate anxiety and moodiness. Will have her resume cymbalta 30 mg daily.

## 2020-07-05 NOTE — Assessment & Plan Note (Signed)
Flu shot yearly. Covid-19 up to date. Shingrix complete. Tetanus up to date. Colonoscopy up to date. Mammogram up to date, pap smear up to date. Counseled about sun safety and mole surveillance. Counseled about the dangers of distracted driving. Given 10 year screening recommendations.  ? ?

## 2020-07-05 NOTE — Progress Notes (Signed)
   Subjective:   Patient ID: Nicole Wilcox, female    DOB: 12/09/1960, 60 y.o.   MRN: [id number]  HPI The patient is a 60 YO female coming in for physical.   PMH, FMH, social history reviewed and updated  Review of Systems  Constitutional: Negative.   HENT: Negative.    Eyes: Negative.   Respiratory:  Negative for cough, chest tightness and shortness of breath.   Cardiovascular:  Negative for chest pain, palpitations and leg swelling.  Gastrointestinal:  Negative for abdominal distention, abdominal pain, constipation, diarrhea, nausea and vomiting.  Musculoskeletal: Negative.   Skin: Negative.   Neurological: Negative.   Psychiatric/Behavioral:  Positive for dysphoric mood.    Objective:  Physical Exam Constitutional:      Appearance: She is well-developed.  HENT:     Head: Normocephalic and atraumatic.  Cardiovascular:     Rate and Rhythm: Normal rate and regular rhythm.  Pulmonary:     Effort: Pulmonary effort is normal. No respiratory distress.     Breath sounds: Normal breath sounds. No wheezing or rales.  Abdominal:     General: Bowel sounds are normal. There is no distension.     Palpations: Abdomen is soft.     Tenderness: There is no abdominal tenderness. There is no rebound.  Musculoskeletal:     Cervical back: Normal range of motion.  Skin:    General: Skin is warm and dry.  Neurological:     Mental Status: She is alert and oriented to person, place, and time.     Coordination: Coordination normal.    Vitals:   07/05/20 0833  BP: 124/80  Pulse: (!) 55  Resp: 18  Temp: 98.2 F (36.8 C)  TempSrc: Oral  SpO2: 95%  Weight: 195 lb 3.2 oz (88.5 kg)  Height: 5\' 6"  (1.676 m)    This visit occurred during the SARS-CoV-2 public health emergency.  Safety protocols were in place, including screening questions prior to the visit, additional usage of staff PPE, and extensive cleaning of exam room while observing appropriate contact time as indicated for  disinfecting solutions.   Assessment & Plan:

## 2020-07-05 NOTE — Patient Instructions (Signed)
We will have you start taking the cymbalta again to see if this helps with the mood.

## 2020-07-06 ENCOUNTER — Encounter: Payer: Federal, State, Local not specified - PPO | Admitting: Internal Medicine

## 2021-02-27 ENCOUNTER — Telehealth: Payer: Self-pay | Admitting: Internal Medicine

## 2021-02-27 NOTE — Telephone Encounter (Signed)
Noted  

## 2021-02-27 NOTE — Telephone Encounter (Signed)
Patient brought Disability Parking Placard application in to be filled out. Patient was told of the 7 to 10 day time span for forms.  Patient would like to be called at [phone]

## 2021-04-03 DIAGNOSIS — Z1231 Encounter for screening mammogram for malignant neoplasm of breast: Secondary | ICD-10-CM | POA: Diagnosis not present

## 2021-05-21 DIAGNOSIS — F4323 Adjustment disorder with mixed anxiety and depressed mood: Secondary | ICD-10-CM | POA: Diagnosis not present

## 2021-06-02 IMAGING — DX DG FOOT COMPLETE 3+V*L*
3 series · 3 of 3 positions shown · non-contrast
Comparison: None.

CLINICAL DATA: Left foot injury 01/29/2020 with lateral left foot
pain

EXAM:
LEFT FOOT - COMPLETE 3+ VIEW

[foot supine dp]
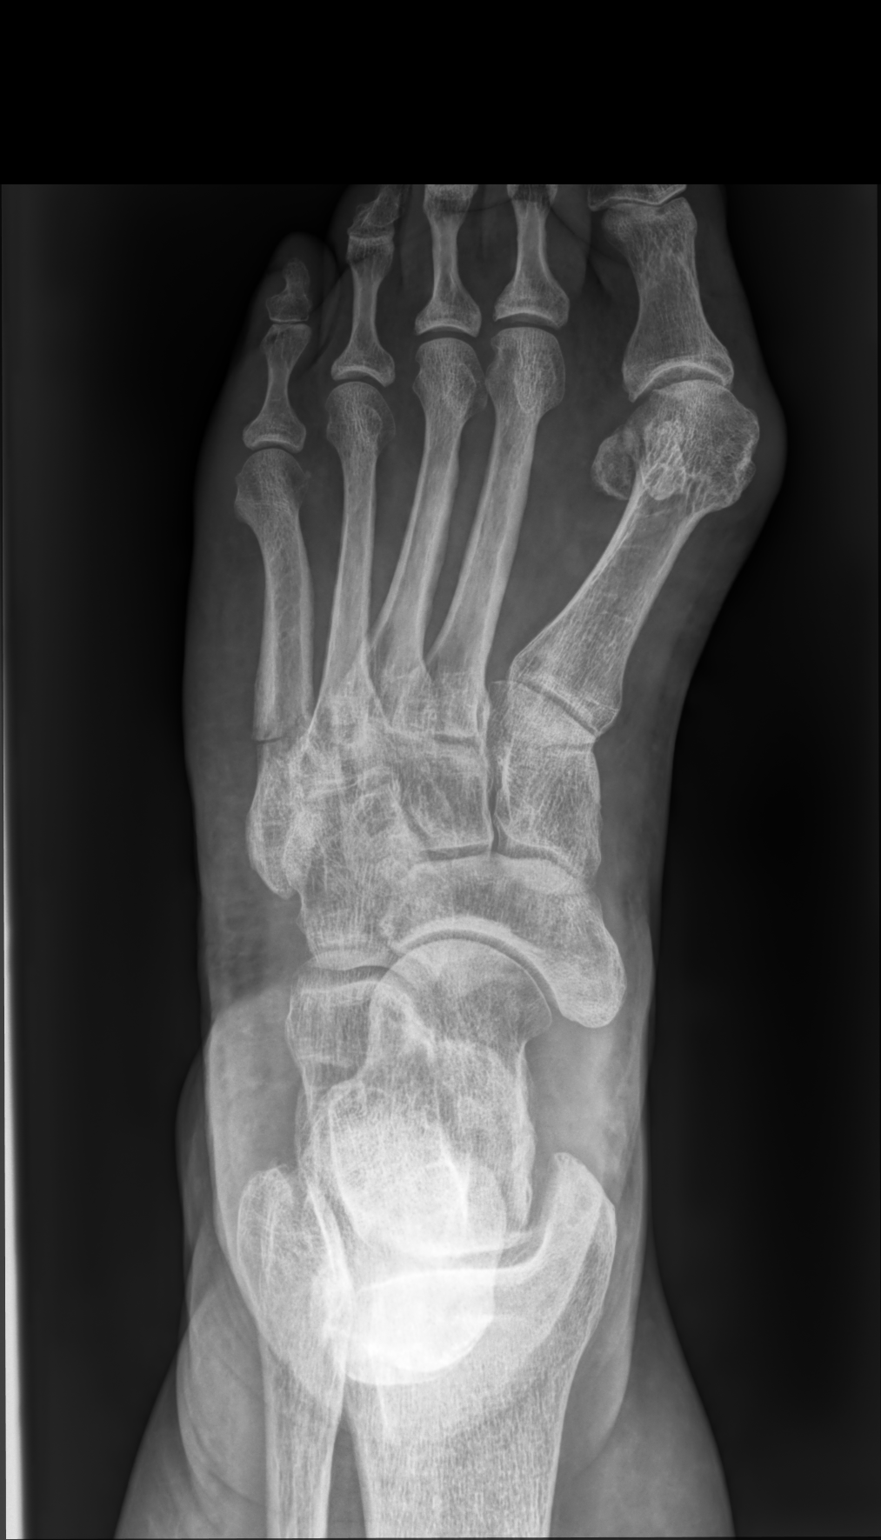

[foot medial oblique]
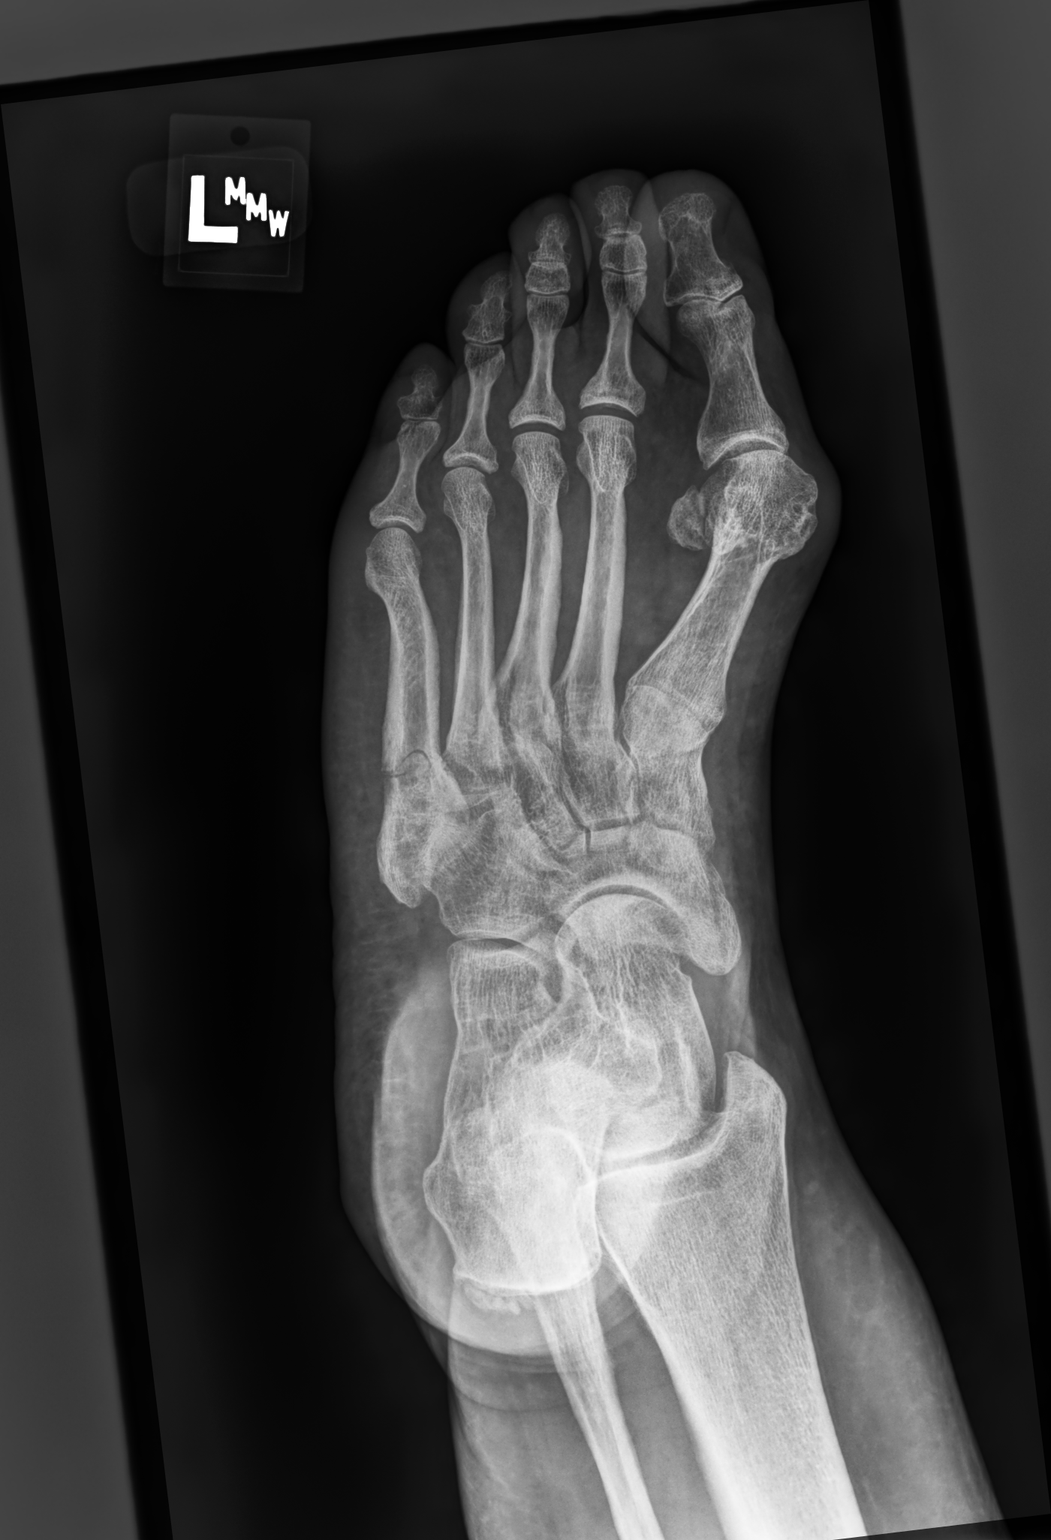

[foot supine lat]
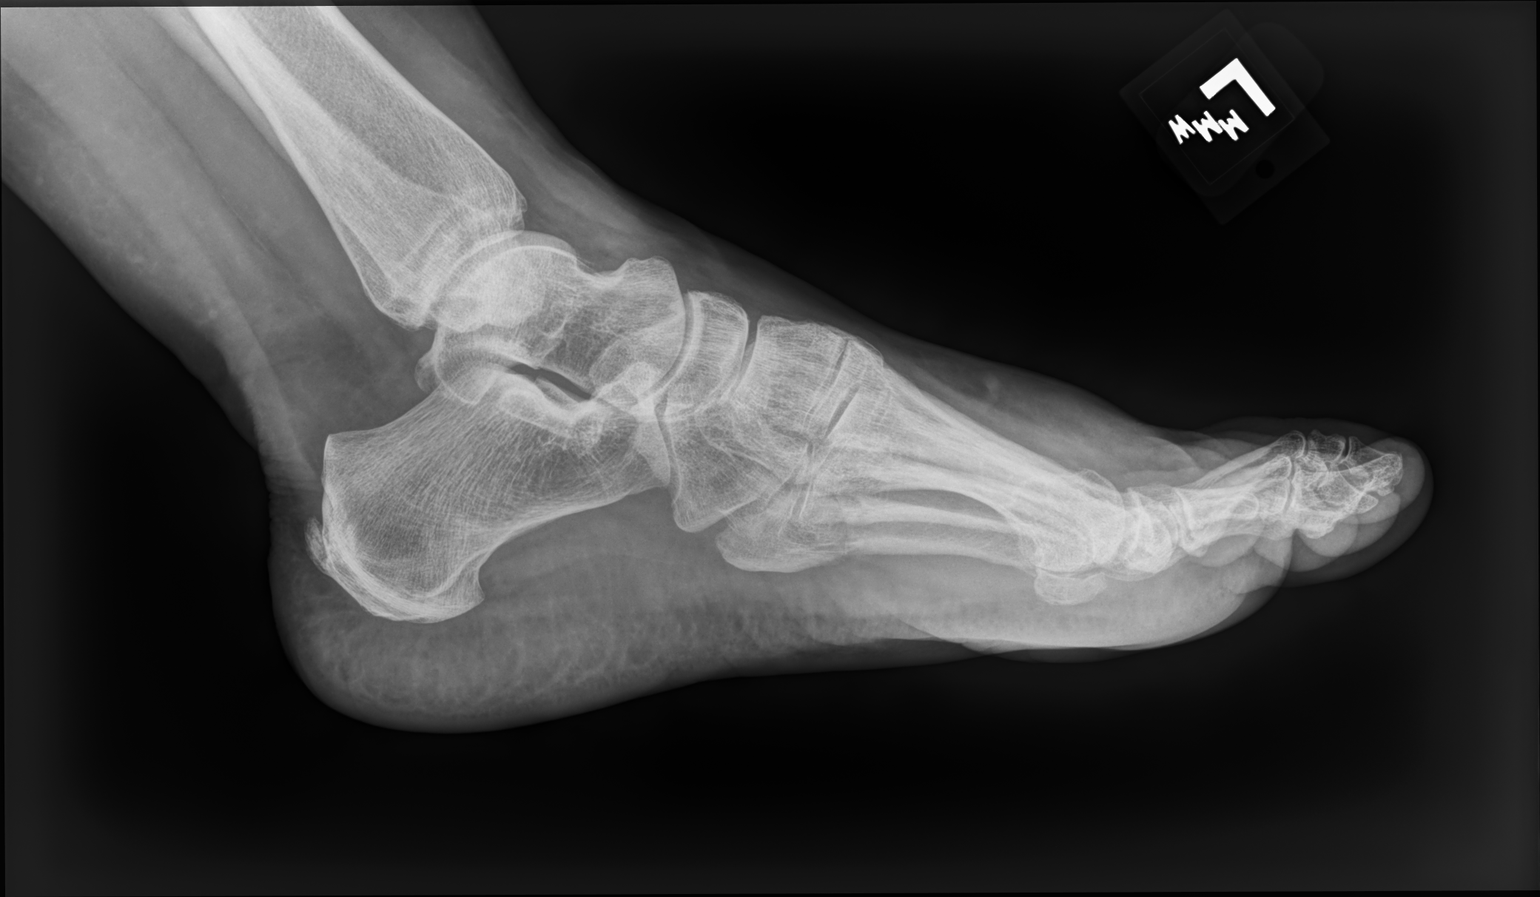

[3 of 3 positions shown; findings below may reference images not displayed]

FINDINGS: Nondisplaced proximal metaphysis fracture in the left fifth
metatarsal, non articular, with surrounding soft tissue swelling. No
additional fracture. No dislocation. No suspicious focal osseous
lesions. Mild-to-moderate hallux valgus deformity with mild first
MTP joint osteoarthritis. Small Achilles left calcaneal spur. No
radiopaque foreign body.
IMPRESSION: 1. Nondisplaced nonarticular proximal metaphysis fracture in the
left fifth metatarsal.
2. Mild-to-moderate hallux valgus deformity with mild first MTP
joint osteoarthritis.

## 2021-06-04 DIAGNOSIS — F4323 Adjustment disorder with mixed anxiety and depressed mood: Secondary | ICD-10-CM | POA: Diagnosis not present

## 2021-06-18 DIAGNOSIS — F4323 Adjustment disorder with mixed anxiety and depressed mood: Secondary | ICD-10-CM | POA: Diagnosis not present

## 2021-07-02 DIAGNOSIS — F4323 Adjustment disorder with mixed anxiety and depressed mood: Secondary | ICD-10-CM | POA: Diagnosis not present

## 2021-07-09 ENCOUNTER — Encounter: Payer: Federal, State, Local not specified - PPO | Admitting: Internal Medicine

## 2021-07-10 ENCOUNTER — Encounter: Payer: Federal, State, Local not specified - PPO | Admitting: Internal Medicine

## 2021-07-13 ENCOUNTER — Other Ambulatory Visit: Payer: Self-pay | Admitting: Internal Medicine

## 2021-07-15 ENCOUNTER — Other Ambulatory Visit: Payer: Self-pay

## 2021-07-15 ENCOUNTER — Telehealth: Payer: Self-pay

## 2021-07-15 MED ORDER — DULOXETINE HCL 30 MG PO CPEP: ORAL_CAPSULE | ORAL | 3 refills | Status: DC

## 2021-07-15 NOTE — Progress Notes (Unsigned)
Patient was last seen 07/05/2020 will refill medication for Duloxetine 30 mg one daily for one time to allow patient an opportunity to schedule office visit. Sent patient a message via My Chart advising her to make an appointment.

## 2021-07-16 DIAGNOSIS — F4323 Adjustment disorder with mixed anxiety and depressed mood: Secondary | ICD-10-CM | POA: Diagnosis not present

## 2021-07-16 NOTE — Telephone Encounter (Signed)
Refill for the Duloxetine 30 mg to be sent to your pharmacy.

## 2021-07-23 ENCOUNTER — Ambulatory Visit (INDEPENDENT_AMBULATORY_CARE_PROVIDER_SITE_OTHER): Payer: Federal, State, Local not specified - PPO | Admitting: Internal Medicine

## 2021-07-23 ENCOUNTER — Encounter: Payer: Self-pay | Admitting: Internal Medicine

## 2021-07-23 VITALS — BP 122/70 | HR 67 | Resp 18 | Ht 66.0 in | Wt 209.4 lb

## 2021-07-23 DIAGNOSIS — E6609 Other obesity due to excess calories: Secondary | ICD-10-CM | POA: Diagnosis not present

## 2021-07-23 DIAGNOSIS — Z0000 Encounter for general adult medical examination without abnormal findings: Secondary | ICD-10-CM

## 2021-07-23 DIAGNOSIS — Z136 Encounter for screening for cardiovascular disorders: Secondary | ICD-10-CM

## 2021-07-23 DIAGNOSIS — F419 Anxiety disorder, unspecified: Secondary | ICD-10-CM | POA: Diagnosis not present

## 2021-07-23 DIAGNOSIS — Z6833 Body mass index (BMI) 33.0-33.9, adult: Secondary | ICD-10-CM

## 2021-07-23 DIAGNOSIS — F32A Depression, unspecified: Secondary | ICD-10-CM | POA: Diagnosis not present

## 2021-07-23 LAB — COMPREHENSIVE METABOLIC PANEL
ALT: 17 U/L (ref 0–35)
AST: 25 U/L (ref 0–37)
Albumin: 4.2 g/dL (ref 3.5–5.2)
Alkaline Phosphatase: 62 U/L (ref 39–117)
BUN: 28 mg/dL — ABNORMAL HIGH (ref 6–23)
CO2: 28 mEq/L (ref 19–32)
Calcium: 9.5 mg/dL (ref 8.4–10.5)
Chloride: 107 mEq/L (ref 96–112)
Creatinine, Ser: 0.69 mg/dL (ref 0.40–1.20)
GFR: 93.69 mL/min (ref >60.00)
Glucose, Bld: 79 mg/dL (ref 70–99)
Potassium: 4.2 mEq/L (ref 3.5–5.1)
Sodium: 143 mEq/L (ref 135–145)
Total Bilirubin: 0.4 mg/dL (ref 0.2–1.2)
Total Protein: 6.8 g/dL (ref 6.0–8.3)

## 2021-07-23 LAB — LIPID PANEL
Cholesterol: 202 mg/dL — ABNORMAL HIGH (ref 0–200)
HDL: 80.8 mg/dL (ref >39.00)
LDL Cholesterol: 103 mg/dL — ABNORMAL HIGH (ref 0–99)
NonHDL: 121.02
Total CHOL/HDL Ratio: 2
Triglycerides: 92 mg/dL (ref 0.0–149.0)
VLDL: 18.4 mg/dL (ref 0.0–40.0)

## 2021-07-23 LAB — CBC
HCT: 39.2 % (ref 36.0–46.0)
Hemoglobin: 13.1 g/dL (ref 12.0–15.0)
MCHC: 33.4 g/dL (ref 30.0–36.0)
MCV: 93.1 fl (ref 78.0–100.0)
Platelets: 213 10*3/uL (ref 150.0–400.0)
RBC: 4.21 Mil/uL (ref 3.87–5.11)
RDW: 13.7 % (ref 11.5–15.5)
WBC: 7.9 10*3/uL (ref 4.0–10.5)

## 2021-07-23 LAB — TSH: TSH: 2.15 u[IU]/mL (ref 0.35–5.50)

## 2021-07-23 NOTE — Progress Notes (Signed)
   Subjective:   Patient ID: Nicole Wilcox, female    DOB: [birth year], 60 y.o.   MRN: [id number]  HPI The patient is here for physical.  PMH, [facility], social history reviewed and updated  Review of Systems  Constitutional: Negative.   HENT: Negative.    Eyes: Negative.   Respiratory:  Negative for cough, chest tightness and shortness of breath.   Cardiovascular:  Negative for chest pain, palpitations and leg swelling.  Gastrointestinal:  Negative for abdominal distention, abdominal pain, constipation, diarrhea, nausea and vomiting.  Musculoskeletal: Negative.   Skin: Negative.   Neurological: Negative.   Psychiatric/Behavioral:  Positive for dysphoric mood. Negative for decreased concentration. The patient is nervous/anxious.     Objective:  Physical Exam Constitutional:      Appearance: She is well-developed.  HENT:     Head: Normocephalic and atraumatic.  Cardiovascular:     Rate and Rhythm: Normal rate and regular rhythm.  Pulmonary:     Effort: Pulmonary effort is normal. No respiratory distress.     Breath sounds: Normal breath sounds. No wheezing or rales.  Abdominal:     General: Bowel sounds are normal. There is no distension.     Palpations: Abdomen is soft.     Tenderness: There is no abdominal tenderness. There is no rebound.  Musculoskeletal:        General: Tenderness present.     Cervical back: Normal range of motion.  Skin:    General: Skin is warm and dry.  Neurological:     Mental Status: She is alert and oriented to person, place, and time.     Coordination: Coordination normal.     Vitals:   07/23/21 0845  BP: 122/70  Pulse: 67  Resp: 18  SpO2: 96%  Weight: 209 lb 6.4 oz (95 kg)  Height: 5\' 6"  (1.676 m)    Assessment & Plan:

## 2021-09-24 DIAGNOSIS — F4323 Adjustment disorder with mixed anxiety and depressed mood: Secondary | ICD-10-CM | POA: Diagnosis not present

## 2021-10-14 ENCOUNTER — Inpatient Hospital Stay (HOSPITAL_BASED_OUTPATIENT_CLINIC_OR_DEPARTMENT_OTHER)
Admission: EM | Admit: 2021-10-14 | Discharge: 2021-10-23 | DRG: 392 | Disposition: A | Discharge status: Home / Self Care | Payer: Federal, State, Local not specified - PPO | Attending: General Surgery | Admitting: General Surgery

## 2021-10-14 ENCOUNTER — Emergency Department (HOSPITAL_BASED_OUTPATIENT_CLINIC_OR_DEPARTMENT_OTHER): Payer: Federal, State, Local not specified - PPO

## 2021-10-14 ENCOUNTER — Encounter (HOSPITAL_COMMUNITY): Payer: Self-pay | Admitting: Certified Registered Nurse Anesthetist

## 2021-10-14 ENCOUNTER — Encounter (HOSPITAL_COMMUNITY): Admission: EM | Disposition: A | Discharge status: Home / Self Care | Payer: Self-pay | Source: Home / Self Care

## 2021-10-14 ENCOUNTER — Other Ambulatory Visit: Payer: Self-pay

## 2021-10-14 ENCOUNTER — Encounter (HOSPITAL_BASED_OUTPATIENT_CLINIC_OR_DEPARTMENT_OTHER): Payer: Self-pay | Admitting: Emergency Medicine

## 2021-10-14 DIAGNOSIS — Z825 Family history of asthma and other chronic lower respiratory diseases: Secondary | ICD-10-CM

## 2021-10-14 DIAGNOSIS — Z809 Family history of malignant neoplasm, unspecified: Secondary | ICD-10-CM | POA: Diagnosis not present

## 2021-10-14 DIAGNOSIS — Z823 Family history of stroke: Secondary | ICD-10-CM | POA: Diagnosis not present

## 2021-10-14 DIAGNOSIS — R188 Other ascites: Secondary | ICD-10-CM | POA: Diagnosis present

## 2021-10-14 DIAGNOSIS — Z1611 Resistance to penicillins: Secondary | ICD-10-CM | POA: Diagnosis not present

## 2021-10-14 DIAGNOSIS — R1032 Left lower quadrant pain: Secondary | ICD-10-CM | POA: Diagnosis not present

## 2021-10-14 DIAGNOSIS — K668 Other specified disorders of peritoneum: Secondary | ICD-10-CM

## 2021-10-14 DIAGNOSIS — K5720 Diverticulitis of large intestine with perforation and abscess without bleeding: Secondary | ICD-10-CM | POA: Diagnosis not present

## 2021-10-14 DIAGNOSIS — Z79899 Other long term (current) drug therapy: Secondary | ICD-10-CM

## 2021-10-14 DIAGNOSIS — Z833 Family history of diabetes mellitus: Secondary | ICD-10-CM

## 2021-10-14 DIAGNOSIS — R9431 Abnormal electrocardiogram [ECG] [EKG]: Secondary | ICD-10-CM | POA: Diagnosis not present

## 2021-10-14 DIAGNOSIS — B9620 Unspecified Escherichia coli [E. coli] as the cause of diseases classified elsewhere: Secondary | ICD-10-CM | POA: Diagnosis not present

## 2021-10-14 DIAGNOSIS — Z9049 Acquired absence of other specified parts of digestive tract: Secondary | ICD-10-CM

## 2021-10-14 DIAGNOSIS — E876 Hypokalemia: Secondary | ICD-10-CM | POA: Diagnosis not present

## 2021-10-14 DIAGNOSIS — Z8249 Family history of ischemic heart disease and other diseases of the circulatory system: Secondary | ICD-10-CM | POA: Diagnosis not present

## 2021-10-14 DIAGNOSIS — Z90710 Acquired absence of both cervix and uterus: Secondary | ICD-10-CM

## 2021-10-14 DIAGNOSIS — I700 Atherosclerosis of aorta: Secondary | ICD-10-CM | POA: Diagnosis not present

## 2021-10-14 DIAGNOSIS — N739 Female pelvic inflammatory disease, unspecified: Secondary | ICD-10-CM

## 2021-10-14 DIAGNOSIS — Z87891 Personal history of nicotine dependence: Secondary | ICD-10-CM | POA: Diagnosis not present

## 2021-10-14 DIAGNOSIS — Z20822 Contact with and (suspected) exposure to covid-19: Secondary | ICD-10-CM | POA: Diagnosis not present

## 2021-10-14 DIAGNOSIS — K631 Perforation of intestine (nontraumatic): Secondary | ICD-10-CM | POA: Diagnosis not present

## 2021-10-14 LAB — COMPREHENSIVE METABOLIC PANEL
ALT: 17 U/L (ref 0–44)
AST: 23 U/L (ref 15–41)
Albumin: 3.7 g/dL (ref 3.5–5.0)
Alkaline Phosphatase: 54 U/L (ref 38–126)
Anion gap: 8 (ref 5–15)
BUN: 18 mg/dL (ref 8–23)
CO2: 27 mmol/L (ref 22–32)
Calcium: 8.8 mg/dL — ABNORMAL LOW (ref 8.9–10.3)
Chloride: 96 mmol/L — ABNORMAL LOW (ref 98–111)
Creatinine, Ser: 1.11 mg/dL — ABNORMAL HIGH (ref 0.44–1.00)
GFR, Estimated: 57 mL/min — ABNORMAL LOW (ref >60)
Glucose, Bld: 137 mg/dL — ABNORMAL HIGH (ref 70–99)
Potassium: 3.2 mmol/L — ABNORMAL LOW (ref 3.5–5.1)
Sodium: 131 mmol/L — ABNORMAL LOW (ref 135–145)
Total Bilirubin: 1.1 mg/dL (ref 0.3–1.2)
Total Protein: 7.1 g/dL (ref 6.5–8.1)

## 2021-10-14 LAB — URINALYSIS, ROUTINE W REFLEX MICROSCOPIC
Glucose, UA: NEGATIVE mg/dL
Ketones, ur: NEGATIVE mg/dL
Leukocytes,Ua: NEGATIVE
Nitrite: NEGATIVE
Protein, ur: 30 mg/dL — AB
Specific Gravity, Urine: 1.015 (ref 1.005–1.030)
pH: 5.5 (ref 5.0–8.0)

## 2021-10-14 LAB — CBC WITH DIFFERENTIAL/PLATELET
Abs Immature Granulocytes: 0.03 10*3/uL (ref 0.00–0.07)
Basophils Absolute: 0.1 10*3/uL (ref 0.0–0.1)
Basophils Relative: 1 %
Eosinophils Absolute: 0.2 10*3/uL (ref 0.0–0.5)
Eosinophils Relative: 2 %
HCT: 40.9 % (ref 36.0–46.0)
Hemoglobin: 14.1 g/dL (ref 12.0–15.0)
Immature Granulocytes: 0 %
Lymphocytes Relative: 9 %
Lymphs Abs: 0.9 10*3/uL (ref 0.7–4.0)
MCH: 31.1 pg (ref 26.0–34.0)
MCHC: 34.5 g/dL (ref 30.0–36.0)
MCV: 90.3 fL (ref 80.0–100.0)
Monocytes Absolute: 0.6 10*3/uL (ref 0.1–1.0)
Monocytes Relative: 6 %
Neutro Abs: 7.8 10*3/uL — ABNORMAL HIGH (ref 1.7–7.7)
Neutrophils Relative %: 82 %
Platelets: 183 10*3/uL (ref 150–400)
RBC: 4.53 MIL/uL (ref 3.87–5.11)
RDW: 12.8 % (ref 11.5–15.5)
WBC: 9.6 10*3/uL (ref 4.0–10.5)
nRBC: 0 % (ref 0.0–0.2)

## 2021-10-14 LAB — CBC
HCT: 39.3 % (ref 36.0–46.0)
Hemoglobin: 12.8 g/dL (ref 12.0–15.0)
MCH: 31.1 pg (ref 26.0–34.0)
MCHC: 32.6 g/dL (ref 30.0–36.0)
MCV: 95.6 fL (ref 80.0–100.0)
Platelets: 167 10*3/uL (ref 150–400)
RBC: 4.11 MIL/uL (ref 3.87–5.11)
RDW: 13.1 % (ref 11.5–15.5)
WBC: 10.3 10*3/uL (ref 4.0–10.5)
nRBC: 0 % (ref 0.0–0.2)

## 2021-10-14 LAB — RESP PANEL BY RT-PCR (FLU A&B, COVID) ARPGX2
Influenza A by PCR: NEGATIVE
Influenza B by PCR: NEGATIVE
SARS Coronavirus 2 by RT PCR: NEGATIVE

## 2021-10-14 LAB — PROTIME-INR
INR: 1.5 — ABNORMAL HIGH (ref 0.8–1.2)
Prothrombin Time: 18.2 seconds — ABNORMAL HIGH (ref 11.4–15.2)

## 2021-10-14 LAB — URINALYSIS, MICROSCOPIC (REFLEX): WBC, UA: NONE SEEN WBC/hpf (ref 0–5)

## 2021-10-14 LAB — LACTIC ACID, PLASMA: Lactic Acid, Venous: 1.3 mmol/L (ref 0.5–1.9)

## 2021-10-14 LAB — CREATININE, SERUM
Creatinine, Ser: 0.88 mg/dL (ref 0.44–1.00)
GFR, Estimated: >60 mL/min (ref >60)

## 2021-10-14 LAB — APTT: aPTT: 26 seconds (ref 24–36)

## 2021-10-14 LAB — MAGNESIUM: Magnesium: 1.8 mg/dL (ref 1.7–2.4)

## 2021-10-14 LAB — LIPASE, BLOOD: Lipase: 24 U/L (ref 11–51)

## 2021-10-14 SURGERY — LAPAROTOMY, EXPLORATORY
Anesthesia: General

## 2021-10-14 MED ORDER — HEPARIN SODIUM (PORCINE) 5000 UNIT/ML IJ SOLN
5000.0000 [IU] | Freq: Three times a day (TID) | INTRAMUSCULAR | Status: DC
  Administered 2021-10-14 – 2021-10-19 (×14): 5000 [IU] via SUBCUTANEOUS
  Filled 2021-10-14 (×14): qty 1

## 2021-10-14 MED ORDER — ONDANSETRON HCL 4 MG/2ML IJ SOLN
4.0000 mg | Freq: Once | INTRAMUSCULAR | Status: AC
  Administered 2021-10-14: 4 mg via INTRAVENOUS
  Filled 2021-10-14: qty 2

## 2021-10-14 MED ORDER — DIPHENHYDRAMINE HCL 12.5 MG/5ML PO ELIX
12.5000 mg | ORAL_SOLUTION | Freq: Four times a day (QID) | ORAL | Status: DC | PRN
  Administered 2021-10-23: 12.5 mg via ORAL
  Filled 2021-10-14: qty 5

## 2021-10-14 MED ORDER — ACETAMINOPHEN 650 MG RE SUPP: 650.0000 mg | Freq: Four times a day (QID) | RECTAL | Status: DC | PRN

## 2021-10-14 MED ORDER — METRONIDAZOLE 500 MG/100ML IV SOLN
500.0000 mg | Freq: Once | INTRAVENOUS | Status: AC
  Administered 2021-10-14: 500 mg via INTRAVENOUS
  Filled 2021-10-14: qty 100

## 2021-10-14 MED ORDER — SODIUM CHLORIDE 0.9 % IV SOLN
2.0000 g | Freq: Once | INTRAVENOUS | Status: AC
  Administered 2021-10-14: 2 g via INTRAVENOUS
  Filled 2021-10-14: qty 12.5

## 2021-10-14 MED ORDER — SODIUM CHLORIDE 0.9 % IV BOLUS
1000.0000 mL | Freq: Once | INTRAVENOUS | Status: AC
  Administered 2021-10-14: 1000 mL via INTRAVENOUS

## 2021-10-14 MED ORDER — ONDANSETRON HCL 4 MG/2ML IJ SOLN
4.0000 mg | Freq: Four times a day (QID) | INTRAMUSCULAR | Status: DC | PRN
  Filled 2021-10-14: qty 2

## 2021-10-14 MED ORDER — ONDANSETRON 4 MG PO TBDP: 4.0000 mg | ORAL_TABLET | Freq: Four times a day (QID) | ORAL | Status: DC | PRN

## 2021-10-14 MED ORDER — DULOXETINE HCL 30 MG PO CPEP
30.0000 mg | ORAL_CAPSULE | Freq: Every day | ORAL | Status: DC
  Administered 2021-10-15 – 2021-10-23 (×9): 30 mg via ORAL
  Filled 2021-10-14 (×9): qty 1

## 2021-10-14 MED ORDER — HYDROMORPHONE HCL 1 MG/ML IJ SOLN
1.0000 mg | INTRAMUSCULAR | Status: DC | PRN
  Administered 2021-10-14 – 2021-10-17 (×5): 1 mg via INTRAVENOUS
  Filled 2021-10-14 (×5): qty 1

## 2021-10-14 MED ORDER — ACETAMINOPHEN 325 MG PO TABS
650.0000 mg | ORAL_TABLET | Freq: Four times a day (QID) | ORAL | Status: DC | PRN
  Administered 2021-10-16 – 2021-10-18 (×5): 650 mg via ORAL
  Filled 2021-10-14 (×5): qty 2

## 2021-10-14 MED ORDER — SODIUM CHLORIDE 0.9 % IV BOLUS (SEPSIS)
1000.0000 mL | Freq: Once | INTRAVENOUS | Status: AC
  Administered 2021-10-14: 1000 mL via INTRAVENOUS

## 2021-10-14 MED ORDER — SODIUM CHLORIDE 0.9 % IV SOLN: INTRAVENOUS | Status: DC

## 2021-10-14 MED ORDER — LACTATED RINGERS IV SOLN: INTRAVENOUS | Status: DC

## 2021-10-14 MED ORDER — DIPHENHYDRAMINE HCL 50 MG/ML IJ SOLN: 12.5000 mg | Freq: Four times a day (QID) | INTRAMUSCULAR | Status: DC | PRN

## 2021-10-14 MED ORDER — MORPHINE SULFATE (PF) 2 MG/ML IV SOLN
2.0000 mg | Freq: Once | INTRAVENOUS | Status: AC
  Administered 2021-10-14: 2 mg via INTRAVENOUS
  Filled 2021-10-14: qty 1

## 2021-10-14 MED ORDER — PIPERACILLIN-TAZOBACTAM 3.375 G IVPB
3.3750 g | Freq: Three times a day (TID) | INTRAVENOUS | Status: DC
  Administered 2021-10-15 – 2021-10-23 (×26): 3.375 g via INTRAVENOUS
  Filled 2021-10-14 (×26): qty 50

## 2021-10-14 MED ORDER — IOHEXOL 300 MG/ML  SOLN
100.0000 mL | Freq: Once | INTRAMUSCULAR | Status: AC | PRN
  Administered 2021-10-14: 100 mL via INTRAVENOUS

## 2021-10-14 SURGICAL SUPPLY — 34 items
BAG COUNTER SPONGE SURGICOUNT (BAG) IMPLANT
BLADE EXTENDED COATED 6.5IN (ELECTRODE) IMPLANT
CHLORAPREP W/TINT 26 (MISCELLANEOUS) ×1 IMPLANT
COVER MAYO STAND STRL (DRAPES) ×3 IMPLANT
COVER SURGICAL LIGHT HANDLE (MISCELLANEOUS) ×1 IMPLANT
DRAPE LAPAROSCOPIC ABDOMINAL (DRAPES) ×1 IMPLANT
DRSG OPSITE POSTOP 4X10 (GAUZE/BANDAGES/DRESSINGS) IMPLANT
DRSG OPSITE POSTOP 4X6 (GAUZE/BANDAGES/DRESSINGS) IMPLANT
DRSG OPSITE POSTOP 4X8 (GAUZE/BANDAGES/DRESSINGS) IMPLANT
ELECT REM PT RETURN 15FT ADLT (MISCELLANEOUS) ×1 IMPLANT
GAUZE SPONGE 4X4 12PLY STRL (GAUZE/BANDAGES/DRESSINGS) IMPLANT
GLOVE BIO SURGEON STRL SZ7 (GLOVE) ×2 IMPLANT
GLOVE BIOGEL PI IND STRL 7.5 (GLOVE) ×2 IMPLANT
GOWN STRL REUS W/ TWL LRG LVL3 (GOWN DISPOSABLE) ×2 IMPLANT
GOWN STRL REUS W/TWL LRG LVL3 (GOWN DISPOSABLE) ×2
HANDLE SUCTION POOLE (INSTRUMENTS) IMPLANT
KIT TURNOVER KIT A (KITS) IMPLANT
PACK COLON (CUSTOM PROCEDURE TRAY) ×1 IMPLANT
PENCIL SMOKE EVACUATOR (MISCELLANEOUS) IMPLANT
STAPLER VISISTAT 35W (STAPLE) ×1 IMPLANT
SUCTION POOLE HANDLE (INSTRUMENTS)
SUT NOVA NAB GS-21 0 18 T12 DT (SUTURE) IMPLANT
SUT NOVA NAB GS-21 1 T12 (SUTURE) IMPLANT
SUT PDS AB 1 TP1 96 (SUTURE) IMPLANT
SUT SILK 2 0 (SUTURE) ×1
SUT SILK 2 0 SH CR/8 (SUTURE) ×1 IMPLANT
SUT SILK 2-0 18XBRD TIE 12 (SUTURE) ×1 IMPLANT
SUT SILK 3 0 (SUTURE) ×1
SUT SILK 3 0 SH CR/8 (SUTURE) ×1 IMPLANT
SUT SILK 3-0 18XBRD TIE 12 (SUTURE) ×1 IMPLANT
TOWEL OR 17X26 10 PK STRL BLUE (TOWEL DISPOSABLE) IMPLANT
TOWEL OR NON WOVEN STRL DISP B (DISPOSABLE) ×1 IMPLANT
TRAY FOLEY MTR SLVR 14FR STAT (SET/KITS/TRAYS/PACK) IMPLANT
TRAY FOLEY MTR SLVR 16FR STAT (SET/KITS/TRAYS/PACK) IMPLANT

## 2021-10-14 NOTE — ED Notes (Signed)
Phone Handoff Report given to [facility] Transport Team 

## 2021-10-14 NOTE — ED Notes (Signed)
Patient transported to CT 

## 2021-10-14 NOTE — Anesthesia Preprocedure Evaluation (Deleted)
Anesthesia Evaluation    Reviewed: Allergy & Precautions, Patient's Chart, lab work & pertinent test resultsPreop documentation limited or incomplete due to emergent nature of procedure.  History of Anesthesia Complications Negative for: history of anesthetic complications  Airway        Dental   Pulmonary former smoker,           Cardiovascular negative cardio ROS       Neuro/Psych PSYCHIATRIC DISORDERS Anxiety Depression  Vertigo     GI/Hepatic Neg liver ROS,  Bowel perforation    Endo/Other   Na 131 K 3.2 Ca 8.8 Cl 96   Renal/GU negative Renal ROS     Musculoskeletal  (+) Arthritis ,   Abdominal   Peds  Hematology negative hematology ROS (+)   Anesthesia Other Findings   Reproductive/Obstetrics  s/p hysterectomy                              Anesthesia Physical Anesthesia Plan  ASA: 2 and emergent  Anesthesia Plan: General   Post-op Pain Management: Ofirmev IV (intra-op)*, Dilaudid IV and Ketamine IV*   Induction: Intravenous, Rapid sequence and Cricoid pressure planned  PONV Risk Score and Plan: 3 and Treatment may vary due to age or medical condition, Ondansetron, Dexamethasone, Midazolam and Scopolamine patch - Pre-op  Airway Management Planned: Oral ETT  Additional Equipment: None  Intra-op Plan:   Post-operative Plan: Possible Post-op intubation/ventilation  Informed Consent:   Plan Discussed with: CRNA and Anesthesiologist  Anesthesia Plan Comments:         Anesthesia Quick Evaluation

## 2021-10-14 NOTE — ED Provider Notes (Signed)
Patient arrives as a transfer from freestanding ED with concern for bowel perforation on CT.  She is stable on arrival, well-appearing.  Blood pressures within normal limits.  She has fairly minimal tenderness on abdominal exam.  Dr. Donne Hazel was notified upon her arrival and will see her in consultation.  Plan for surgery admission   Wyvonnia Dusky, MD 10/14/21 2001

## 2021-10-14 NOTE — ED Provider Notes (Signed)
[facility] EMERGENCY DEPARTMENT Provider Note   CSN: [id number] Arrival date & time: 10/14/21  1400     History {Add pertinent medical, surgical, social history, OB history to HPI:1} Chief Complaint  Patient presents with   Diarrhea    Nicole Wilcox is a 61 y.o. female.  61 yo F with a chief complaints of lower abdominal discomfort.  Patient noticed this a few days ago and thought that she was constipated she did up taking a laxative and since then has had diffuse diarrhea.  She has felt very fatigued and feels like she has been sleeping much more than normal.  She denies any other new medications.  Denies urinary symptoms.   Diarrhea      Home Medications Prior to Admission medications   Medication Sig Start Date End Date Taking? Authorizing Provider  acetaminophen (TYLENOL) 650 MG CR tablet Take 650 mg by mouth every 8 (eight) hours as needed for pain.    [provider]  b complex vitamins tablet Take 1 tablet by mouth daily.    [provider]  cholecalciferol (VITAMIN D) 400 UNITS TABS Take by mouth daily.    [provider]  Chromium Picolinate 500 MCG TABS Take by mouth daily.    [provider]  DULoxetine (CYMBALTA) 30 MG capsule TAKE 1 CAPSULE(30 MG) BY MOUTH DAILY 07/15/21   Myrlene Broker, MD  Multiple Vitamin (MULTIVITAMIN) tablet Take 1 tablet by mouth daily.    [provider]  Potassium 99 MG TABS Take by mouth.    [provider]      Allergies    Patient has no known allergies.    Review of Systems   Review of Systems  Gastrointestinal:  Positive for diarrhea.    Physical Exam Updated Vital Signs BP 116/85   Pulse 79   Temp 99.3 F (37.4 C) (Oral)   Resp (!) 30   Ht 5\' 6"  (1.676 m)   Wt 90.7 kg   SpO2 97%   BMI 32.28 kg/m  Physical Exam Vitals and nursing note reviewed.  Constitutional:      General: She is not in acute distress.    Appearance: She is well-developed.  She is not diaphoretic.  HENT:     Head: Normocephalic and atraumatic.  Eyes:     Pupils: Pupils are equal, round, and reactive to light.  Cardiovascular:     Rate and Rhythm: Normal rate and regular rhythm.     Heart sounds: No murmur heard.    No friction rub. No gallop.  Pulmonary:     Effort: Pulmonary effort is normal.     Breath sounds: No wheezing or rales.  Abdominal:     General: There is no distension.     Palpations: Abdomen is soft.     Tenderness: There is no abdominal tenderness.     Comments: Some mild abdominal discomfort in the left lower quadrant.  Otherwise no significant tenderness.  Musculoskeletal:        General: No tenderness.     Cervical back: Normal range of motion and neck supple.  Skin:    General: Skin is warm and dry.  Neurological:     Mental Status: She is alert and oriented to person, place, and time.  Psychiatric:        Behavior: Behavior normal.     ED Results / Procedures / Treatments   Labs (all labs ordered are listed, but only abnormal results are displayed) Labs  Reviewed  CBC WITH DIFFERENTIAL/PLATELET  COMPREHENSIVE METABOLIC PANEL  LIPASE, BLOOD  MAGNESIUM  URINALYSIS, ROUTINE W REFLEX MICROSCOPIC    EKG None  Radiology No results found.  Procedures Procedures  {Document cardiac monitor, telemetry assessment procedure when appropriate:1}  Medications Ordered in ED Medications  sodium chloride 0.9 % bolus 1,000 mL (has no administration in time range)  morphine (PF) 2 MG/ML injection 2 mg (has no administration in time range)  ondansetron (ZOFRAN) injection 4 mg (has no administration in time range)    ED Course/ Medical Decision Making/ A&P                           Medical Decision Making Amount and/or Complexity of Data Reviewed Labs: ordered.  Risk Prescription drug management.   61 yo F with a chief complaints of abdominal pain.  Initially she thought she had constipation and that she took a laxative  and since then has had profuse diarrhea for the past couple days.  She has been feeling fatigued and feels like she has been sleeping very frequently.  She does not think that she is passing out.  Denies any new medications.  Saw her family doctor in the office today who thought that she needed IV fluids and sent her here for evaluation.  She had some popcorn prior to the onset of the symptoms.  She otherwise denies suspicious food or drink.  She has had some nausea and has been able to tolerate a little bit by mouth at home.  We will obtain a laboratory evaluation bolus of IV fluids treat pain and nausea.  Reassess.  {Document critical care time when appropriate:1} {Document review of labs and clinical decision tools ie heart score, Chads2Vasc2 etc:1}  {Document your independent review of radiology images, and any outside records:1} {Document your discussion with family members, caretakers, and with consultants:1} {Document social determinants of health affecting pt's care:1} {Document your decision making why or why not admission, treatments were needed:1} Final Clinical Impression(s) / ED Diagnoses Final diagnoses:  None    Rx / DC Orders ED Discharge Orders     None

## 2021-10-14 NOTE — ED Notes (Addendum)
Blood cultures obtained prior to ABX administration 

## 2021-10-14 NOTE — Sepsis Progress Note (Signed)
Code Sepsis protocol being monitored by [facility]. 

## 2021-10-14 NOTE — H&P (Signed)
Nicole Wilcox is an 61 y.o. female.   Chief Complaint: ab pain HPI: 59 yof who is utd with csc with Dr Nicole Wilcox (has had polyps before) presents with ab pain acutely on Saturday.  This was much worse on Saturday and is actually better today.  She has not really been able to eat/drink a lot as she is having a lot of watery diarrhea.  She came in today as she thought she was dehydrated. No fevers noted.  She is not urinating as much.  She has nl wbc, nl vital signs and nl lactate.  Her cr is elevated to 1.11.  she has a ct scan that shows some free air throughout abdomen that is not very large volume.  She has no fluid collections. Has an area of left colon with thickening and some diverticular disease.  She presented to outside ER and I was called. I asked for er to er transfer and discussed with OR. On arrival she is lying comfortably.   Past Medical History:  Diagnosis Date   Arthritis    Bradycardia     Past Surgical History:  Procedure Laterality Date   ABDOMINAL HYSTERECTOMY     CHOLECYSTECTOMY     GALLBLADDER SURGERY     HERNIA REPAIR     HIP SURGERY     S/P 3 separate hip surgeries in 2004   INGUINAL HERNIA REPAIR     TOTAL VAGINAL HYSTERECTOMY      Family History  Problem Relation Age of Onset   Heart failure Mother    Asthma Mother    Cancer Father    Diabetes Maternal Grandmother    Hypertension Maternal Grandmother    Stroke Maternal Grandmother    Heart disease Maternal Grandfather    Cancer Paternal Grandfather    Diabetes Brother    Social History:  reports that she has quit smoking. Her smoking use included cigarettes. She smoked an average of 1 pack per day. She has quit using smokeless tobacco. She reports that she does not drink alcohol and does not use drugs.  Allergies: No Known Allergies  No current facility-administered medications on file prior to encounter.   Current Outpatient Medications on File Prior to Encounter  Medication Sig Dispense Refill    acetaminophen (TYLENOL) 650 MG CR tablet Take 650 mg by mouth every 8 (eight) hours as needed for pain.     b complex vitamins tablet Take 1 tablet by mouth daily.     cholecalciferol (VITAMIN D) 400 UNITS TABS Take by mouth daily.     Chromium Picolinate 500 MCG TABS Take by mouth daily.     DULoxetine (CYMBALTA) 30 MG capsule TAKE 1 CAPSULE(30 MG) BY MOUTH DAILY 90 capsule 3   Multiple Vitamin (MULTIVITAMIN) tablet Take 1 tablet by mouth daily.     Potassium 99 MG TABS Take by mouth.        Results for orders placed or performed during the hospital encounter of 10/14/21 (from the past 48 hour(s))  CBC with Differential     Status: Abnormal   Collection Time: 10/14/21  2:30 PM  Result Value Ref Range   WBC 9.6 4.0 - 10.5 K/uL   RBC 4.53 3.87 - 5.11 MIL/uL   Hemoglobin 14.1 12.0 - 15.0 g/dL   HCT 91.4 78.2 - 95.6 %   MCV 90.3 80.0 - 100.0 fL   MCH 31.1 26.0 - 34.0 pg   MCHC 34.5 30.0 - 36.0 g/dL   RDW 21.3 08.6 - 57.8 %  Platelets 183 150 - 400 K/uL   nRBC 0.0 0.0 - 0.2 %   Neutrophils Relative % 82 %   Neutro Abs 7.8 (H) 1.7 - 7.7 K/uL   Lymphocytes Relative 9 %   Lymphs Abs 0.9 0.7 - 4.0 K/uL   Monocytes Relative 6 %   Monocytes Absolute 0.6 0.1 - 1.0 K/uL   Eosinophils Relative 2 %   Eosinophils Absolute 0.2 0.0 - 0.5 K/uL   Basophils Relative 1 %   Basophils Absolute 0.1 0.0 - 0.1 K/uL   Immature Granulocytes 0 %   Abs Immature Granulocytes 0.03 0.00 - 0.07 K/uL    Comment: Performed at [facility], [address] [place], [place] [address]  Comprehensive metabolic panel     Status: Abnormal   Collection Time: 10/14/21  2:30 PM  Result Value Ref Range   Sodium 131 (L) 135 - 145 mmol/L   Potassium 3.2 (L) 3.5 - 5.1 mmol/L   Chloride 96 (L) 98 - 111 mmol/L   CO2 27 22 - 32 mmol/L   Glucose, Bld 137 (H) 70 - 99 mg/dL    Comment: Glucose reference range applies only to samples taken after fasting for at least 8 hours.   BUN 18 8 - 23 mg/dL   Creatinine,  Ser 6.041.11 (H) 0.44 - 1.00 mg/dL   Calcium 8.8 (L) 8.9 - 10.3 mg/dL   Total Protein 7.1 6.5 - 8.1 g/dL   Albumin 3.7 3.5 - 5.0 g/dL   AST 23 15 - 41 U/L   ALT 17 0 - 44 U/L   Alkaline Phosphatase 54 38 - 126 U/L   Total Bilirubin 1.1 0.3 - 1.2 mg/dL   GFR, Estimated 57 (L) >60 mL/min    Comment: (NOTE) Calculated using the CKD-EPI Creatinine Equation (2021)    Anion gap 8 5 - 15    Comment: Performed at [facility], [address] [place], [place] [address]  Lipase, blood     Status: None   Collection Time: 10/14/21  2:30 PM  Result Value Ref Range   Lipase 24 11 - 51 U/L    Comment: Performed at [facility], [address] [place], [place] [address]  Magnesium     Status: None   Collection Time: 10/14/21  2:30 PM  Result Value Ref Range   Magnesium 1.8 1.7 - 2.4 mg/dL    Comment: Performed at [facility], [address] [place], [place] [address]  Urinalysis, Routine w reflex microscopic Urine, Clean Catch     Status: Abnormal   Collection Time: 10/14/21  4:08 PM  Result Value Ref Range   Color, Urine YELLOW YELLOW   APPearance CLEAR CLEAR   Specific Gravity, Urine 1.015 1.005 - 1.030   pH 5.5 5.0 - 8.0   Glucose, UA NEGATIVE NEGATIVE mg/dL   Hgb urine dipstick TRACE (A) NEGATIVE   Bilirubin Urine SMALL (A) NEGATIVE   Ketones, ur NEGATIVE NEGATIVE mg/dL   Protein, ur 30 (A) NEGATIVE mg/dL   Nitrite NEGATIVE NEGATIVE   Leukocytes,Ua NEGATIVE NEGATIVE    Comment: Performed at [facility], [address] [place], [place] [address]  Urinalysis, Microscopic (reflex)     Status: Abnormal   Collection Time: 10/14/21  4:08 PM  Result Value Ref Range   RBC / HPF 0-5 0 - 5 RBC/hpf   WBC, UA NONE SEEN 0 - 5 WBC/hpf   Bacteria, UA  RARE (A) NONE SEEN   Squamous Epithelial / LPF 0-5 0 - 5   Hyaline Casts, UA PRESENT     Comment: Performed at [facility], [address] [place], [place] [address]  Lactic acid, plasma      Status: None   Collection Time: 10/14/21  6:13 PM  Result Value Ref Range   Lactic Acid, Venous 1.3 0.5 - 1.9 mmol/L    Comment: Performed at [facility], [address] [place], [place] [address]  Resp Panel by RT-PCR (Flu A&B, Covid) Anterior Nasal Swab     Status: None   Collection Time: 10/14/21  6:24 PM   Specimen: Anterior Nasal Swab  Result Value Ref Range   SARS Coronavirus 2 by RT PCR NEGATIVE NEGATIVE    Comment: (NOTE) SARS-CoV-2 target nucleic acids are NOT DETECTED.  The SARS-CoV-2 RNA is generally detectable in upper respiratory specimens during the acute phase of infection. The lowest concentration of SARS-CoV-2 viral copies this assay can detect is 138 copies/mL. A negative result does not preclude SARS-Cov-2 infection and should not be used as the sole basis for treatment or other patient management decisions. A negative result may occur with  improper specimen collection/handling, submission of specimen other than nasopharyngeal swab, presence of viral mutation(s) within the areas targeted by this assay, and inadequate number of viral copies(<138 copies/mL). A negative result must be combined with clinical observations, patient history, and epidemiological information. The expected result is Negative.  Fact Sheet for Patients:  EntrepreneurPulse.com.au  Fact Sheet for Healthcare Providers:  IncredibleEmployment.be  This test is no t yet approved or cleared by the [place] FDA and  has been authorized for detection and/or diagnosis of SARS-CoV-2 by FDA under an Emergency Use Authorization (EUA). This EUA will remain  in effect (meaning this test can be used) for the duration of the COVID-19 declaration under Section 564(b)(1) of the Act, 21 U.S.C.section 360bbb-3(b)(1), unless the authorization is terminated  or revoked sooner.       Influenza A by PCR NEGATIVE NEGATIVE   Influenza B by PCR NEGATIVE  NEGATIVE    Comment: (NOTE) The Xpert Xpress SARS-CoV-2/FLU/RSV plus assay is intended as an aid in the diagnosis of influenza from Nasopharyngeal swab specimens and should not be used as a sole basis for treatment. Nasal washings and aspirates are unacceptable for Xpert Xpress SARS-CoV-2/FLU/RSV testing.  Fact Sheet for Patients: EntrepreneurPulse.com.au  Fact Sheet for Healthcare Providers: IncredibleEmployment.be  This test is not yet approved or cleared by the [place] FDA and has been authorized for detection and/or diagnosis of SARS-CoV-2 by FDA under an Emergency Use Authorization (EUA). This EUA will remain in effect (meaning this test can be used) for the duration of the COVID-19 declaration under Section 564(b)(1) of the Act, 21 U.S.C. section 360bbb-3(b)(1), unless the authorization is terminated or revoked.  Performed at [facility], [address] [place], [place] [address]    CT ABDOMEN PELVIS W CONTRAST  Result Date: 10/14/2021 CLINICAL DATA:  Left lower quadrant abdominal pain and constipation. History of cholecystectomy and hysterectomy. EXAM: CT ABDOMEN AND PELVIS WITH CONTRAST TECHNIQUE: Multidetector CT imaging of the abdomen and pelvis was performed using the standard protocol following bolus administration of intravenous contrast. RADIATION DOSE REDUCTION: This exam was performed according to the departmental dose-optimization program which includes automated exposure control, adjustment of the mA and/or kV according to patient size and/or use of iterative reconstruction technique. CONTRAST:  OMNIPAQUE IOHEXOL 300 MG/ML  SOLN COMPARISON:  None Available. FINDINGS: Lower chest: Mild platelike dependent bibasilar scarring versus atelectasis. Hepatobiliary: Normal liver size. No liver mass. Cholecystectomy. Bile ducts are within normal post cholecystectomy limits with CBD diameter 9 mm. Pancreas: Normal, with no  mass or duct dilation. Spleen: Normal size. No mass. Adrenals/Urinary Tract: Normal adrenals. Normal kidneys with no hydronephrosis and no renal mass. Normal bladder. Stomach/Bowel: Normal non-distended stomach. Normal caliber small bowel with no small bowel wall thickening. Normal appendix. Oral contrast transits to the distal colon. Moderate sigmoid diverticulosis. Abnormal wall thickening throughout distal transverse colon and throughout the sigmoid colon, with associated pericolonic fat stranding and ill-defined fluid. Scattered foci of pericolonic free air in the left lower quadrant (series 2/image 69). Vascular/Lymphatic: Atherosclerotic nonaneurysmal abdominal aorta. Patent portal, splenic, hepatic and renal veins. No pathologically enlarged lymph nodes in the abdomen or pelvis. Reproductive: Status post hysterectomy, with no abnormal findings at the vaginal cuff. No adnexal mass. Other: Widespread free intraperitoneal air in upper peritoneal cavity, most prominent under the anterior right hemidiaphragm. No focal measurable fluid collection. Small amount of ill-defined fluid in the pelvic cul-de-sac. Musculoskeletal: No aggressive appearing focal osseous lesions. Moderate lumbar spondylosis. Left total hip arthroplasty. IMPRESSION: 1. Widespread free intraperitoneal air, most prominent under the anterior right hemidiaphragm, compatible with perforated viscus, probably of colonic origin. No focal measurable fluid collection. 2. Abnormal wall thickening throughout the distal transverse colon and sigmoid colon, with associated pericolonic fat stranding and ill-defined fluid. Underlying moderate sigmoid diverticulosis. Findings may be due to perforated acute diverticulitis with associated reactive wall thickening in the long abnormal segments of left colon, although other causes of colitis such as C diff colitis cannot be excluded. 3. Aortic Atherosclerosis (ICD10-I70.0). Critical Value/emergent results were  called by telephone at the time of interpretation on 10/14/2021 at 5:52 pm to provider [facility] , who verbally acknowledged these results. Electronically Signed   By: Delbert Phenix M.D.   On: 10/14/2021 17:54    Review of Systems  Constitutional:  Negative for fever.  Gastrointestinal:  Positive for abdominal pain, diarrhea and nausea. Negative for abdominal distention, blood in stool and vomiting.  All other systems reviewed and are negative.   Blood pressure (!) 110/93, pulse 82, temperature 97.6 F (36.4 C), temperature source Oral, resp. rate 16, height 5\' 6"  (1.676 m), weight 90.7 kg, SpO2 96 %. Physical Exam Constitutional:      General: She is not in acute distress.    Appearance: Normal appearance.  HENT:     Head: Normocephalic.  Eyes:     General: No scleral icterus.    Extraocular Movements: Extraocular movements intact.  Cardiovascular:     Rate and Rhythm: Normal rate and regular rhythm.  Pulmonary:     Effort: Pulmonary effort is normal.  Abdominal:     Palpations: Abdomen is soft.     Tenderness: There is abdominal tenderness (mild tender llq, no peritonitis).     Hernia: No hernia is present.  Musculoskeletal:     Cervical back: Normal range of motion and neck supple.     Right lower leg: No edema.     Left lower leg: No edema.  Skin:    General: Skin is warm and dry.     Capillary Refill: Capillary refill takes less than 2 seconds.     Coloration: Skin is not jaundiced.  Neurological:     General: No focal deficit present.     Mental Status: She  is alert.  Psychiatric:        Mood and Affect: Mood normal.        Behavior: Behavior normal.      Assessment/Plan Pneumoperitoneum -I think she perforated left or sigmoid colon on Saturday and this has improved.   She certainly could need an operation for this but I think it is not unreasonable given underwhelming exam, nl vitals, nl lactate and nl wbc to give her course of nonoperative mgt.  Discussed  other option of colectomy with likely colostomy. She understands nonoperative mgt may fail even tonight or in future in which case will need surgery. She is agreeable to this plan. -not sure of source. Could be diverticular or from colitis. She had  pain and then began to have diarrhea after she took a laxative but I think checking a c diff on her for what she is describing is reasonable also.   -will start on zosyn for more likely source being diverticular -will hydrate overnight and recheck Cr and K in the morning -subq heparin  I reviewed cbc, bmet. I discussed case with ER providers. I independently reviewed her ct scan and agree  Emelia Loron, MD 10/14/2021, 8:23 PM

## 2021-10-14 NOTE — Progress Notes (Signed)
PHARMACY NOTE -  [facility] has been assisting with dosing of Zosyn for perforated bowel. Dosage remains stable at 3.375 g IV q8 hr and further renal adjustments per institutional Pharmacy antibiotic protocol  Pharmacy will sign off, following peripherally for culture results or dose adjustments. Please reconsult if a change in clinical status warrants re-evaluation of dosage.  Reuel Boom, PharmD, BCPS [phone] 10/14/2021, 8:30 PM

## 2021-10-14 NOTE — ED Triage Notes (Signed)
Patient presents to ED via POV from home. Patient reports abdominal pain and constipation on Saturday. Patient took gas-X and laxatives. Presents today with weakness and diarrhea. Patient states "I can barely stay awake".

## 2021-10-14 NOTE — ED Notes (Signed)
Per ems fentanyl 168mcg en route.

## 2021-10-14 NOTE — ED Provider Notes (Signed)
  Physical Exam  BP (!) 89/66   Pulse 68   Temp 99.3 F (37.4 C) (Oral)   Resp (!) 26   Ht 5\' 6"  (1.676 m)   Wt 90.7 kg   SpO2 97%   BMI 32.28 kg/m   Physical Exam  Procedures  .Critical Care  Performed by: Varney Biles, MD Authorized by: Varney Biles, MD   Critical care provider statement:    Critical care time (minutes):  36   Critical care was necessary to treat or prevent imminent or life-threatening deterioration of the following conditions: Perforated viscus.   Critical care was time spent personally by me on the following activities:  Development of treatment plan with patient or surrogate, discussions with consultants, evaluation of patient's response to treatment, examination of patient, ordering and review of laboratory studies, ordering and review of radiographic studies, ordering and performing treatments and interventions, pulse oximetry, re-evaluation of patient's condition and review of old charts   I assumed direction of critical care for this patient from another provider in my specialty: yes     ED Course / MDM    Medical Decision Making Amount and/or Complexity of Data Reviewed Labs: ordered. Radiology: ordered. ECG/medicine tests: ordered.  Risk Prescription drug management.   I assumed care of this patient from Dr. Tyrone Nine.  Patient had come in with abdominal discomfort for the last few days that suddenly became severe today.  She also had some diarrhea.  She is complaining of generalized weakness.  Patient had mild lower quadrant discomfort, but otherwise reassuring exam.  Basic labs have been ordered, patient has slight elevation of creatinine. WC is normal.  Urinalysis pending.  Plan is to reassess the patient after urine analysis.  If patient is feeling well, then she might be able to be discharged.  Otherwise we will need to proceed with CAT scan.   Reassessment: 17:55 I reassessed the patient after urine analysis.  Her UA looked  normal. Patient indicated to me that she was feeling better compared to when she came in.  However, she was laying with her knees flexed, and I proceeded to repeat abdominal exam.  Patient had significant tenderness in the lower quadrants with guarding over the left lower quadrant.  She informed me that when she walked to the bathroom a few minutes ago she was able to tolerate it.  She denies any known history of diverticulosis.  No medical problems.   We will proceed with CT abdomen and pelvis with contrast.  Patient does not want any pain medicine.  Reassessment at 6:15 PM: Perforated colon per radiology service.  I have independently visualized the CAT scan, there is free air.  Patient's blood pressure is 89/66.  I will order 1 more liter of IV fluid. We will start cefepime and Flagyl.  I consulted general surgery, spoke with Dr. Donne Hazel. Patient will be transferred ER to ER. Dr. Francia Greaves is excepting at [facility] long ED. [facility] has been contacted by our staff.  Patient has been informed about the findings. Her repeat blood pressure while I was in the room was 113/76.         Varney Biles, MD 10/14/21 [phone]

## 2021-10-14 NOTE — ED Notes (Signed)
Phone Handoff Report provided to Charge RN at [facility] ED

## 2021-10-15 DIAGNOSIS — K668 Other specified disorders of peritoneum: Secondary | ICD-10-CM | POA: Diagnosis not present

## 2021-10-15 LAB — CBC
HCT: 39.5 % (ref 36.0–46.0)
Hemoglobin: 12.9 g/dL (ref 12.0–15.0)
MCH: 31.4 pg (ref 26.0–34.0)
MCHC: 32.7 g/dL (ref 30.0–36.0)
MCV: 96.1 fL (ref 80.0–100.0)
Platelets: 160 10*3/uL (ref 150–400)
RBC: 4.11 MIL/uL (ref 3.87–5.11)
RDW: 13.2 % (ref 11.5–15.5)
WBC: 9.4 10*3/uL (ref 4.0–10.5)
nRBC: 0 % (ref 0.0–0.2)

## 2021-10-15 LAB — BASIC METABOLIC PANEL
Anion gap: 9 (ref 5–15)
BUN: 14 mg/dL (ref 8–23)
CO2: 21 mmol/L — ABNORMAL LOW (ref 22–32)
Calcium: 8.4 mg/dL — ABNORMAL LOW (ref 8.9–10.3)
Chloride: 106 mmol/L (ref 98–111)
Creatinine, Ser: 0.79 mg/dL (ref 0.44–1.00)
GFR, Estimated: >60 mL/min (ref >60)
Glucose, Bld: 101 mg/dL — ABNORMAL HIGH (ref 70–99)
Potassium: 3.1 mmol/L — ABNORMAL LOW (ref 3.5–5.1)
Sodium: 136 mmol/L (ref 135–145)

## 2021-10-15 LAB — C DIFFICILE QUICK SCREEN W PCR REFLEX
C Diff antigen: NEGATIVE
C Diff interpretation: NOT DETECTED
C Diff toxin: NEGATIVE

## 2021-10-15 MED ORDER — POTASSIUM CHLORIDE 10 MEQ/100ML IV SOLN
INTRAVENOUS | Status: AC
  Administered 2021-10-15: 10 meq via INTRAVENOUS
  Filled 2021-10-15: qty 100

## 2021-10-15 MED ORDER — POTASSIUM CHLORIDE 10 MEQ/100ML IV SOLN
10.0000 meq | INTRAVENOUS | Status: AC
  Administered 2021-10-15 (×3): 10 meq via INTRAVENOUS
  Filled 2021-10-15 (×3): qty 100

## 2021-10-15 NOTE — Progress Notes (Addendum)
1 Day Post-Op  Subjective: CC: Reports pain peaked on Sat. Pain is much improved from presentation and is now a 2/10 (was a 4/10 before pain meds at 0324 this am, no prn pain medications since). Was having diarrhea up until yesterday morning. No flatus or bm since. Reports she is thirsty. No hx of similar pain. Denies hx of diverticulitis. No recent abx or travel. Reports last csc was ~4 years ago but I cannot see these results.   Objective: Vital signs in last 24 hours: Temp:  [97.6 F (36.4 C)-99.9 F (37.7 C)] 98.4 F (36.9 C) (09/19 0559) Pulse Rate:  [67-100] 93 (09/19 0559) Resp:  [14-30] 20 (09/19 0559) BP: (89-143)/(63-93) 125/77 (09/19 0559) SpO2:  [90 %-100 %] 90 % (09/19 0559) Weight:  [90.7 kg] 90.7 kg (09/18 1411) Last BM Date : 10/14/21  Intake/Output from previous day: 09/18 0701 - 09/19 0700 In: 3400.7 [I.V.:1394.2; IV Piggyback:2006.6] Out: 550 [Urine:550] Intake/Output this shift: No intake/output data recorded.  PE: Gen:  Alert, NAD, pleasant Card:  Reg Pulm:  CTAB, no W/R/R, effort normal Abd: Soft, ND, very mild ttp of the LLQ, +BS Ext:  No LE edema  Psych: A&Ox3   Lab Results:  Recent Labs    10/14/21 2021 10/15/21 0120  WBC 10.3 9.4  HGB 12.8 12.9  HCT 39.3 39.5  PLT 167 160   BMET Recent Labs    10/14/21 1430 10/14/21 2021 10/15/21 0120  NA 131*  --  136  K 3.2*  --  3.1*  CL 96*  --  106  CO2 27  --  21*  GLUCOSE 137*  --  101*  BUN 18  --  14  CREATININE 1.11* 0.88 0.79  CALCIUM 8.8*  --  8.4*   PT/INR Recent Labs    10/14/21 1933  LABPROT 18.2*  INR 1.5*   CMP     Component Value Date/Time   NA 136 10/15/2021 0120   K 3.1 (L) 10/15/2021 0120   CL 106 10/15/2021 0120   CO2 21 (L) 10/15/2021 0120   GLUCOSE 101 (H) 10/15/2021 0120   BUN 14 10/15/2021 0120   CREATININE 0.79 10/15/2021 0120   CREATININE 0.81 09/07/2015 1555   CALCIUM 8.4 (L) 10/15/2021 0120   PROT 7.1 10/14/2021 1430   ALBUMIN 3.7 10/14/2021  1430   AST 23 10/14/2021 1430   ALT 17 10/14/2021 1430   ALKPHOS 54 10/14/2021 1430   BILITOT 1.1 10/14/2021 1430   GFRNONAA >60 10/15/2021 0120   GFRNONAA 81 10/07/2013 1515   GFRAA >60 08/15/2018 1718   GFRAA >89 10/07/2013 1515   Lipase     Component Value Date/Time   LIPASE 24 10/14/2021 1430    Studies/Results: CT ABDOMEN PELVIS W CONTRAST  Result Date: 10/14/2021 CLINICAL DATA:  Left lower quadrant abdominal pain and constipation. History of cholecystectomy and hysterectomy. EXAM: CT ABDOMEN AND PELVIS WITH CONTRAST TECHNIQUE: Multidetector CT imaging of the abdomen and pelvis was performed using the standard protocol following bolus administration of intravenous contrast. RADIATION DOSE REDUCTION: This exam was performed according to the departmental dose-optimization program which includes automated exposure control, adjustment of the mA and/or kV according to patient size and/or use of iterative reconstruction technique. CONTRAST:  131mL OMNIPAQUE IOHEXOL 300 MG/ML  SOLN COMPARISON:  None Available. FINDINGS: Lower chest: Mild platelike dependent bibasilar scarring versus atelectasis. Hepatobiliary: Normal liver size. No liver mass. Cholecystectomy. Bile ducts are within normal post cholecystectomy limits with CBD diameter 9 mm. Pancreas:  Normal, with no mass or duct dilation. Spleen: Normal size. No mass. Adrenals/Urinary Tract: Normal adrenals. Normal kidneys with no hydronephrosis and no renal mass. Normal bladder. Stomach/Bowel: Normal non-distended stomach. Normal caliber small bowel with no small bowel wall thickening. Normal appendix. Oral contrast transits to the distal colon. Moderate sigmoid diverticulosis. Abnormal wall thickening throughout distal transverse colon and throughout the sigmoid colon, with associated pericolonic fat stranding and ill-defined fluid. Scattered foci of pericolonic free air in the left lower quadrant (series 2/image 69). Vascular/Lymphatic:  Atherosclerotic nonaneurysmal abdominal aorta. Patent portal, splenic, hepatic and renal veins. No pathologically enlarged lymph nodes in the abdomen or pelvis. Reproductive: Status post hysterectomy, with no abnormal findings at the vaginal cuff. No adnexal mass. Other: Widespread free intraperitoneal air in upper peritoneal cavity, most prominent under the anterior right hemidiaphragm. No focal measurable fluid collection. Small amount of ill-defined fluid in the pelvic cul-de-sac. Musculoskeletal: No aggressive appearing focal osseous lesions. Moderate lumbar spondylosis. Left total hip arthroplasty. IMPRESSION: 1. Widespread free intraperitoneal air, most prominent under the anterior right hemidiaphragm, compatible with perforated viscus, probably of colonic origin. No focal measurable fluid collection. 2. Abnormal wall thickening throughout the distal transverse colon and sigmoid colon, with associated pericolonic fat stranding and ill-defined fluid. Underlying moderate sigmoid diverticulosis. Findings may be due to perforated acute diverticulitis with associated reactive wall thickening in the long abnormal segments of left colon, although other causes of colitis such as C diff colitis cannot be excluded. 3. Aortic Atherosclerosis (ICD10-I70.0). Critical Value/emergent results were called by telephone at the time of interpretation on 10/14/2021 at 5:52 pm to provider [facility] , who verbally acknowledged these results. Electronically Signed   By: Delbert Phenix M.D.   On: 10/14/2021 17:54    Anti-infectives: Anti-infectives (From admission, onward)    Start     Dose/Rate Route Frequency Ordered Stop   10/15/21 0000  piperacillin-tazobactam (ZOSYN) IVPB 3.375 g        3.375 g 12.5 mL/hr over 240 Minutes Intravenous Every 8 hours 10/14/21 2029     10/14/21 1815  ceFEPIme (MAXIPIME) 2 g in sodium chloride 0.9 % 100 mL IVPB       See Hyperspace for full Linked Orders Report.   2 g 200 mL/hr over 30  Minutes Intravenous  Once 10/14/21 1801 10/14/21 1953   10/14/21 1815  metroNIDAZOLE (FLAGYL) IVPB 500 mg       See Hyperspace for full Linked Orders Report.   500 mg 100 mL/hr over 60 Minutes Intravenous  Once 10/14/21 1801 10/14/21 1953        Assessment/Plan Pneumoperitoneum - Agree with Dr. Doreen Salvage assessment from H&P note. I think she perforated left or sigmoid colon on Saturday and this has improved.  She certainly could need an operation for this but I think it is not unreasonable given reassuring exam, normal vitals, normal lactate and normal wbc to give her course of nonoperative mgt.  Unclear of source. Could be diverticular or from colitis. She had pain and then began to have diarrhea after she took a laxative so we are checking c diff on her.  - Cont abx - Leave NPO for now - Daily labs  FEN - NPO (ice chips), cont IVF VTE - SCDs, subq heparin ID - Zosyn  I reviewed nursing notes, ED provider notes, last 24 h vitals and pain scores, last 48 h intake and output, last 24 h labs and trends, and last 24 h imaging results.     LOS:  1 day    Jacinto Halim , [facility] Surgery 10/15/2021, 8:50 AM Please see Amion for pager number during day hours 7:00am-4:30pm

## 2021-10-15 NOTE — Progress Notes (Signed)
Mobility Specialist - Progress Note   10/15/21 1029  Mobility  HOB Elevated/Bed Position Self regulated  Activity Ambulated independently in hallway  Range of Motion/Exercises Active  Level of Assistance Independent  Assistive Device None  Distance Ambulated (ft) 500 ft  Activity Response Tolerated well  Transport method Ambulatory  $Mobility charge 1 Mobility   Pt received in bed and agreeable to mobility. No complaints during ambulation. Pt to room at EOS with all necessities in reach.    [facility]

## 2021-10-15 NOTE — Plan of Care (Signed)
  Problem: Education: Goal: Knowledge of General Education information will improve Description Including pain rating scale, medication(s)/side effects and non-pharmacologic comfort measures Outcome: Progressing   

## 2021-10-16 LAB — BASIC METABOLIC PANEL
Anion gap: 8 (ref 5–15)
BUN: 16 mg/dL (ref 8–23)
CO2: 20 mmol/L — ABNORMAL LOW (ref 22–32)
Calcium: 8.4 mg/dL — ABNORMAL LOW (ref 8.9–10.3)
Chloride: 110 mmol/L (ref 98–111)
Creatinine, Ser: 0.75 mg/dL (ref 0.44–1.00)
GFR, Estimated: >60 mL/min (ref >60)
Glucose, Bld: 96 mg/dL (ref 70–99)
Potassium: 3.1 mmol/L — ABNORMAL LOW (ref 3.5–5.1)
Sodium: 138 mmol/L (ref 135–145)

## 2021-10-16 LAB — CBC
HCT: 32.1 % — ABNORMAL LOW (ref 36.0–46.0)
Hemoglobin: 10.6 g/dL — ABNORMAL LOW (ref 12.0–15.0)
MCH: 31.3 pg (ref 26.0–34.0)
MCHC: 33 g/dL (ref 30.0–36.0)
MCV: 94.7 fL (ref 80.0–100.0)
Platelets: 143 10*3/uL — ABNORMAL LOW (ref 150–400)
RBC: 3.39 MIL/uL — ABNORMAL LOW (ref 3.87–5.11)
RDW: 13.2 % (ref 11.5–15.5)
WBC: 10.1 10*3/uL (ref 4.0–10.5)
nRBC: 0 % (ref 0.0–0.2)

## 2021-10-16 LAB — MAGNESIUM: Magnesium: 2 mg/dL (ref 1.7–2.4)

## 2021-10-16 MED ORDER — IPRATROPIUM-ALBUTEROL 0.5-2.5 (3) MG/3ML IN SOLN
3.0000 mL | Freq: Four times a day (QID) | RESPIRATORY_TRACT | Status: DC | PRN
  Administered 2021-10-16 – 2021-10-19 (×7): 3 mL via RESPIRATORY_TRACT
  Filled 2021-10-16 (×7): qty 3

## 2021-10-16 NOTE — TOC Initial Note (Signed)
Transition of Care [facility]) - Initial/Assessment Note    Patient Details  Name: Nicole Wilcox MRN: [id number] Date of Birth: July 24, 1960  Transition of Care [facility]) CM/SW Contact:    Golda Acre, RN Phone Number: 10/16/2021, 7:51 AM  Clinical Narrative:                  Transition of Care [facility]) Screening Note   Patient Details  Name: Nicole Wilcox Date of Birth: January 09, 1961   Transition of Care [facility]) CM/SW Contact:    Golda Acre, RN Phone Number: 10/16/2021, 7:52 AM    Transition of Care Department [facility]) has reviewed patient and no TOC needs have been identified at this time. We will continue to monitor patient advancement through interdisciplinary progression rounds. If new patient transition needs arise, please place a TOC consult.    Expected Discharge Plan: Home/Self Care Barriers to Discharge: Continued Medical Work up   Patient Goals and CMS Choice Patient states their goals for this hospitalization and ongoing recovery are:: to go home CMS Medicare.gov Compare Post Acute Care list provided to:: Patient Choice offered to / list presented to : Patient  Expected Discharge Plan and Services Expected Discharge Plan: Home/Self Care   Discharge Planning Services: CM Consult   Living arrangements for the past 2 months: Single Family Home                                      Prior Living Arrangements/Services Living arrangements for the past 2 months: Single Family Home Lives with:: Spouse Patient language and need for interpreter reviewed:: Yes Do you feel safe going back to the place where you live?: Yes            Criminal Activity/Legal Involvement Pertinent to Current Situation/Hospitalization: No - Comment as needed  Activities of Daily Living Home Assistive Devices/Equipment: Eyeglasses, Shower chair with back ADL Screening (condition at time of admission) Patient's cognitive ability adequate to safely complete daily activities?:  Yes Is the patient deaf or have difficulty hearing?: No Does the patient have difficulty seeing, even when wearing glasses/contacts?: No Does the patient have difficulty concentrating, remembering, or making decisions?: No Patient able to express need for assistance with ADLs?: Yes Does the patient have difficulty dressing or bathing?: Yes Independently performs ADLs?: No Communication: Independent Dressing (OT): Needs assistance Is this a change from baseline?: Change from baseline, expected to last >3 days Grooming: Independent Feeding: Independent Bathing: Needs assistance Is this a change from baseline?: Change from baseline, expected to last >3 days Toileting: Needs assistance Is this a change from baseline?: Change from baseline, expected to last >3days In/Out Bed: Needs assistance Is this a change from baseline?: Change from baseline, expected to last >3 days Walks in Home: Needs assistance Is this a change from baseline?: Change from baseline, expected to last >3 days Does the patient have difficulty walking or climbing stairs?: Yes Weakness of Legs: Both Weakness of Arms/Hands: Both  Permission Sought/Granted                  Emotional Assessment Appearance:: Appears stated age Attitude/Demeanor/Rapport: Engaged Affect (typically observed): Calm Orientation: : Oriented to Self, Oriented to Place, Oriented to  Time, Oriented to Situation Alcohol / Substance Use: Tobacco Use (quit both smoking and non smoking tobacco products) Psych Involvement: No (comment)  Admission diagnosis:  Peritoneal cavity free air [K66.8] Diverticulitis of colon with perforation [  K57.20] Patient Active Problem List   Diagnosis Date Noted   Diverticulitis of colon with perforation 10/14/2021   Vertigo 08/18/2018   Acute pain of left knee 03/04/2018   Mid back pain 11/05/2017   Pain following surgery or procedure 02/04/2016   Anxiety and depression 08/22/2014   Chronic left hip pain  02/28/2011   Obesity 02/28/2011   Health care maintenance 02/28/2011   PCP:  Hoyt Koch, MD Pharmacy:   [facility] [phone] [address], [place] [address] [place] [address] Phone: [phone] Fax: [phone]  [facility] [place], [place] [address] Phone: [phone] Fax: [phone]     Social Determinants of Health (SDOH) Interventions    Readmission Risk Interventions   No data to display

## 2021-10-16 NOTE — Progress Notes (Signed)
Mobility Specialist Cancellation/Refusal Note:    10/16/21 1012  Mobility  Activity Refused mobility     Reason for Cancellation/Refusal: Pt declined mobility at this time. Pt not feeling well. Requested that I come back tomorrow. Will check back as schedule permits.       [facility]

## 2021-10-17 ENCOUNTER — Inpatient Hospital Stay (HOSPITAL_COMMUNITY): Payer: Federal, State, Local not specified - PPO

## 2021-10-17 DIAGNOSIS — K668 Other specified disorders of peritoneum: Secondary | ICD-10-CM | POA: Diagnosis not present

## 2021-10-17 DIAGNOSIS — R0602 Shortness of breath: Secondary | ICD-10-CM | POA: Diagnosis not present

## 2021-10-17 LAB — CBC
HCT: 33.8 % — ABNORMAL LOW (ref 36.0–46.0)
Hemoglobin: 11.3 g/dL — ABNORMAL LOW (ref 12.0–15.0)
MCH: 31.2 pg (ref 26.0–34.0)
MCHC: 33.4 g/dL (ref 30.0–36.0)
MCV: 93.4 fL (ref 80.0–100.0)
Platelets: 179 10*3/uL (ref 150–400)
RBC: 3.62 MIL/uL — ABNORMAL LOW (ref 3.87–5.11)
RDW: 13.3 % (ref 11.5–15.5)
WBC: 10.6 10*3/uL — ABNORMAL HIGH (ref 4.0–10.5)
nRBC: 0 % (ref 0.0–0.2)

## 2021-10-17 LAB — BASIC METABOLIC PANEL
Anion gap: 7 (ref 5–15)
BUN: 9 mg/dL (ref 8–23)
CO2: 24 mmol/L (ref 22–32)
Calcium: 8 mg/dL — ABNORMAL LOW (ref 8.9–10.3)
Chloride: 106 mmol/L (ref 98–111)
Creatinine, Ser: 0.66 mg/dL (ref 0.44–1.00)
GFR, Estimated: >60 mL/min (ref >60)
Glucose, Bld: 114 mg/dL — ABNORMAL HIGH (ref 70–99)
Potassium: 2.6 mmol/L — CL (ref 3.5–5.1)
Sodium: 137 mmol/L (ref 135–145)

## 2021-10-17 LAB — MAGNESIUM: Magnesium: 1.9 mg/dL (ref 1.7–2.4)

## 2021-10-17 MED ORDER — HYDROMORPHONE HCL 1 MG/ML IJ SOLN: 1.0000 mg | INTRAMUSCULAR | Status: DC | PRN

## 2021-10-17 MED ORDER — POTASSIUM CHLORIDE CRYS ER 20 MEQ PO TBCR
40.0000 meq | EXTENDED_RELEASE_TABLET | Freq: Two times a day (BID) | ORAL | Status: AC
  Administered 2021-10-17 (×2): 40 meq via ORAL
  Filled 2021-10-17 (×2): qty 2

## 2021-10-17 MED ORDER — HYDRALAZINE HCL 20 MG/ML IJ SOLN: 10.0000 mg | Freq: Three times a day (TID) | INTRAMUSCULAR | Status: DC | PRN

## 2021-10-17 MED ORDER — POTASSIUM CHLORIDE 10 MEQ/100ML IV SOLN
10.0000 meq | INTRAVENOUS | Status: AC
  Administered 2021-10-17 (×4): 10 meq via INTRAVENOUS
  Filled 2021-10-17 (×3): qty 100

## 2021-10-17 MED ORDER — OXYCODONE HCL 5 MG PO TABS
5.0000 mg | ORAL_TABLET | ORAL | Status: DC | PRN
  Administered 2021-10-17 – 2021-10-20 (×6): 10 mg via ORAL
  Filled 2021-10-17 (×7): qty 2

## 2021-10-17 NOTE — Progress Notes (Signed)
3 Days Post-Op  Subjective: CC: Fever to 100.8 overnight. No tachycardia or hypotension. Hypertensive. Does not take bp meds at home. Labs pending currently.  Pain better in abdomen since my attending saw her yesterday and started her on cld. More "discomfort" than pain in her llq. 3/10 currently. Last pain medication (dilaudid) at 505am. Passing flatus. More formed bm overnight. C. Diff was negative. Tolerating cld without n/v.  SOB better but still there. No further wheezing after duonebs. Feels like her LEs are more swollen. Quit smoking 11 years ago. No hx of asthma or chf. No echo on file Mobilizing and voiding.   Objective: Vital signs in last 24 hours: Temp:  [97.7 F (36.5 C)-100.8 F (38.2 C)] 100.8 F (38.2 C) (09/21 0510) Pulse Rate:  [67-86] 86 (09/21 0510) Resp:  [17-18] 18 (09/21 0510) BP: (161-178)/(87-95) 171/93 (09/21 0510) SpO2:  [96 %-99 %] 96 % (09/21 0510) Last BM Date : 10/16/21  Intake/Output from previous day: 09/20 0701 - 09/21 0700 In: 3058.1 [P.O.:620; I.V.:2302.3; IV Piggyback:135.8] Out: 0  Intake/Output this shift: No intake/output data recorded.  PE: Gen:  Alert, NAD, pleasant Card:  RRR Pulm:  CTA b/l, normal rate and effort on RA. 750 on IS Abd: Soft, ND, some ttp of the LLQ without rigidity, rebound or guarding, +BS Ext:  Non pitting edema ot b/l LE's. Calves equal in size and non ttp Psych: A&Ox3  Skin: no rashes noted, warm and dry  Lab Results:  Recent Labs    10/15/21 0120 10/16/21 0438  WBC 9.4 10.1  HGB 12.9 10.6*  HCT 39.5 32.1*  PLT 160 143*   BMET Recent Labs    10/15/21 0120 10/16/21 0438  NA 136 138  K 3.1* 3.1*  CL 106 110  CO2 21* 20*  GLUCOSE 101* 96  BUN 14 16  CREATININE 0.79 0.75  CALCIUM 8.4* 8.4*   PT/INR Recent Labs    10/14/21 1933  LABPROT 18.2*  INR 1.5*   CMP     Component Value Date/Time   NA 138 10/16/2021 0438   K 3.1 (L) 10/16/2021 0438   CL 110 10/16/2021 0438   CO2 20 (L)  10/16/2021 0438   GLUCOSE 96 10/16/2021 0438   BUN 16 10/16/2021 0438   CREATININE 0.75 10/16/2021 0438   CREATININE 0.81 09/07/2015 1555   CALCIUM 8.4 (L) 10/16/2021 0438   PROT 7.1 10/14/2021 1430   ALBUMIN 3.7 10/14/2021 1430   AST 23 10/14/2021 1430   ALT 17 10/14/2021 1430   ALKPHOS 54 10/14/2021 1430   BILITOT 1.1 10/14/2021 1430   GFRNONAA >60 10/16/2021 0438   GFRNONAA 81 10/07/2013 1515   GFRAA >60 08/15/2018 1718   GFRAA >89 10/07/2013 1515   Lipase     Component Value Date/Time   LIPASE 24 10/14/2021 1430    Studies/Results: No results found.  Anti-infectives: Anti-infectives (From admission, onward)    Start     Dose/Rate Route Frequency Ordered Stop   10/15/21 0000  piperacillin-tazobactam (ZOSYN) IVPB 3.375 g        3.375 g 12.5 mL/hr over 240 Minutes Intravenous Every 8 hours 10/14/21 2029     10/14/21 1815  ceFEPIme (MAXIPIME) 2 g in sodium chloride 0.9 % 100 mL IVPB       See Hyperspace for full Linked Orders Report.   2 g 200 mL/hr over 30 Minutes Intravenous  Once 10/14/21 1801 10/14/21 1953   10/14/21 1815  metroNIDAZOLE (FLAGYL) IVPB 500 mg  See Hyperspace for full Linked Orders Report.   500 mg 100 mL/hr over 60 Minutes Intravenous  Once 10/14/21 1801 10/14/21 1953        Assessment/Plan Pneumoperitoneum - Suspect that she perforated left or sigmoid colon on Saturday and improved upon presentation. Currently being watched with conservative management given reassuring exam, normal vitals, normal lactate and normal wbc on presentation. Unclear of source. Could be diverticular or from colitis. Her C. Diff was negative.  - Fever (100.8) overnight however she clinically appears to be improving -  reports pain is improved, tolerating cld without n/v and bm's more formed. She has minimal ttp of the llq and no peritonitis. Will leave on cld until labs result. May be able to advance diet as long as those look okay and continue to monitor closely -  Cont abx - Daily labs   FEN - CLD, dc IVF VTE - SCDs, subq heparin ID - Zosyn  SOB - improved with duonebs. On RA. Using IS. Quit smoking 11 years ago. No hx of asthma or chf. No echo on file. Lungs cta b/l. She has some non-pitting edema of ble's. Will dc ivf and get cxr. Monitor.    I reviewed nursing notes, ED provider notes, last 24 h vitals and pain scores, last 48 h intake and output, last 24 h labs and trends, and last 24 h imaging results.   LOS: 3 days    Jacinto Halim , [facility] Surgery 10/17/2021, 8:45 AM Please see Amion for pager number during day hours 7:00am-4:30pm

## 2021-10-17 NOTE — Progress Notes (Signed)
Mobility Specialist - Progress Note   10/17/21 1211  Mobility  HOB Elevated/Bed Position Self regulated  Activity Ambulated with assistance in hallway  Range of Motion/Exercises Active  Level of Assistance Contact guard assist, steadying assist  Assistive Device  (IV Pole)  Distance Ambulated (ft) 110 ft  Activity Response Tolerated well  Transport method Ambulatory  $Mobility charge 1 Mobility   Pt received in recliner and agreeable to mobility. Pt a little unsteady during ambulation. Pt to bathroom after session with all needs met.    [facility]

## 2021-10-17 NOTE — Progress Notes (Signed)
Critical value K+ 2.6 MD notified.

## 2021-10-18 DIAGNOSIS — K668 Other specified disorders of peritoneum: Secondary | ICD-10-CM | POA: Diagnosis not present

## 2021-10-18 LAB — BASIC METABOLIC PANEL
Anion gap: 9 (ref 5–15)
BUN: 5 mg/dL — ABNORMAL LOW (ref 8–23)
CO2: 22 mmol/L (ref 22–32)
Calcium: 8.1 mg/dL — ABNORMAL LOW (ref 8.9–10.3)
Chloride: 104 mmol/L (ref 98–111)
Creatinine, Ser: 0.61 mg/dL (ref 0.44–1.00)
GFR, Estimated: >60 mL/min (ref >60)
Glucose, Bld: 115 mg/dL — ABNORMAL HIGH (ref 70–99)
Potassium: 3.1 mmol/L — ABNORMAL LOW (ref 3.5–5.1)
Sodium: 135 mmol/L (ref 135–145)

## 2021-10-18 LAB — CBC
HCT: 37.1 % (ref 36.0–46.0)
Hemoglobin: 12.5 g/dL (ref 12.0–15.0)
MCH: 31.1 pg (ref 26.0–34.0)
MCHC: 33.7 g/dL (ref 30.0–36.0)
MCV: 92.3 fL (ref 80.0–100.0)
Platelets: 218 10*3/uL (ref 150–400)
RBC: 4.02 MIL/uL (ref 3.87–5.11)
RDW: 13.4 % (ref 11.5–15.5)
WBC: 13 10*3/uL — ABNORMAL HIGH (ref 4.0–10.5)
nRBC: 0 % (ref 0.0–0.2)

## 2021-10-18 MED ORDER — ALUM & MAG HYDROXIDE-SIMETH 200-200-20 MG/5ML PO SUSP
30.0000 mL | Freq: Four times a day (QID) | ORAL | Status: DC | PRN
  Administered 2021-10-18: 30 mL via ORAL
  Filled 2021-10-18: qty 30

## 2021-10-18 MED ORDER — POTASSIUM CHLORIDE CRYS ER 20 MEQ PO TBCR
40.0000 meq | EXTENDED_RELEASE_TABLET | Freq: Two times a day (BID) | ORAL | Status: AC
  Administered 2021-10-18 (×2): 40 meq via ORAL
  Filled 2021-10-18 (×2): qty 2

## 2021-10-18 NOTE — Progress Notes (Signed)
Mobility Specialist - Progress Note   10/18/21 1146  Mobility  HOB Elevated/Bed Position Self regulated  Activity Ambulated independently in hallway  Range of Motion/Exercises Active  Level of Assistance Independent  Assistive Device None  Distance Ambulated (ft) 300 ft  Activity Response Tolerated well  Transport method Ambulatory  $Mobility charge 1 Mobility   Pt received in recliner and agreeable to mobility.No complaints during mobility.  Pt to recliner after session with all needs met.    [facility]

## 2021-10-18 NOTE — Progress Notes (Addendum)
4 Days Post-Op  Subjective: CC: Patient reports she had increased lower abdominal cramping, pain, bloating yesterday afternoon along with nausea.  She had a large liquid BM shortly after and her symptoms resolved.  She feels much better this morning.  Only mild LLQ pain.  No nausea or vomiting.  Tolerating full liquids. No cp. SOB resolved. Mobilizing. Voiding.   Objective: Vital signs in last 24 hours: Temp:  [97.8 F (36.6 C)-98.4 F (36.9 C)] 98.3 F (36.8 C) (09/22 0601) Pulse Rate:  [89-92] 89 (09/22 0601) Resp:  [16-18] 18 (09/22 0601) BP: (146-159)/(88-92) 146/88 (09/22 0601) SpO2:  [96 %-97 %] 96 % (09/22 0601) Last BM Date : 10/18/21  Intake/Output from previous day: 09/21 0701 - 09/22 0700 In: 1509.1 [P.O.:960; IV Piggyback:549.1] Out: 250 [Urine:250] Intake/Output this shift: Total I/O In: 280 [P.O.:240; IV Piggyback:40] Out: 0   PE: Gen:  Alert, NAD, pleasant Card:  RRR Pulm:  CTA b/l, normal rate and effort on RA. Abd: Soft, ND, some ttp of the LLQ without rigidity or guarding, +BS Ext:  No LE edema Psych: A&Ox3  Skin: no rashes noted, warm and dry  Lab Results:  Recent Labs    10/17/21 0924 10/18/21 0441  WBC 10.6* 13.0*  HGB 11.3* 12.5  HCT 33.8* 37.1  PLT 179 218   BMET Recent Labs    10/17/21 0924 10/18/21 0441  NA 137 135  K 2.6* 3.1*  CL 106 104  CO2 24 22  GLUCOSE 114* 115*  BUN 9 5*  CREATININE 0.66 0.61  CALCIUM 8.0* 8.1*   PT/INR No results for input(s): "LABPROT", "INR" in the last 72 hours. CMP     Component Value Date/Time   NA 135 10/18/2021 0441   K 3.1 (L) 10/18/2021 0441   CL 104 10/18/2021 0441   CO2 22 10/18/2021 0441   GLUCOSE 115 (H) 10/18/2021 0441   BUN 5 (L) 10/18/2021 0441   CREATININE 0.61 10/18/2021 0441   CREATININE 0.81 09/07/2015 1555   CALCIUM 8.1 (L) 10/18/2021 0441   PROT 7.1 10/14/2021 1430   ALBUMIN 3.7 10/14/2021 1430   AST 23 10/14/2021 1430   ALT 17 10/14/2021 1430   ALKPHOS 54  10/14/2021 1430   BILITOT 1.1 10/14/2021 1430   GFRNONAA >60 10/18/2021 0441   GFRNONAA 81 10/07/2013 1515   GFRAA >60 08/15/2018 1718   GFRAA >89 10/07/2013 1515   Lipase     Component Value Date/Time   LIPASE 24 10/14/2021 1430    Studies/Results: DG CHEST PORT 1 VIEW  Result Date: 10/17/2021 CLINICAL DATA:  Shortness of breath. EXAM: PORTABLE CHEST 1 VIEW COMPARISON:  None Available. FINDINGS: Cardiomediastinal silhouette is normal. Mediastinal contours appear intact. Left lower lobe atelectasis versus airspace consolidation. No free intra-abdominal gas seen on this semi erect chest x-ray. Osseous structures are without acute abnormality. Soft tissues are grossly normal. IMPRESSION: Left lower lobe atelectasis versus airspace consolidation. Electronically Signed   By: Ted Mcalpine M.D.   On: 10/17/2021 09:54    Anti-infectives: Anti-infectives (From admission, onward)    Start     Dose/Rate Route Frequency Ordered Stop   10/15/21 0000  piperacillin-tazobactam (ZOSYN) IVPB 3.375 g        3.375 g 12.5 mL/hr over 240 Minutes Intravenous Every 8 hours 10/14/21 2029     10/14/21 1815  ceFEPIme (MAXIPIME) 2 g in sodium chloride 0.9 % 100 mL IVPB       See Hyperspace for full Linked Orders Report.  2 g 200 mL/hr over 30 Minutes Intravenous  Once 10/14/21 1801 10/14/21 1953   10/14/21 1815  metroNIDAZOLE (FLAGYL) IVPB 500 mg       See Hyperspace for full Linked Orders Report.   500 mg 100 mL/hr over 60 Minutes Intravenous  Once 10/14/21 1801 10/14/21 1953        Assessment/Plan Pneumoperitoneum - Suspect that she perforated left or sigmoid colon on Saturday and improved upon presentation. Currently being watched with conservative management given reassuring exam, normal vitals, normal lactate and normal wbc on presentation. Unclear of source. Could be diverticular or from colitis. Her C. Diff was negative.  - Fever resolved. WBC up slightly at 13 but symptomatically  improving. She is less ttp in the llq. No peritonitis. She is tolerating fld this am without increased abdominal pain, n/v and is having bowel function. Discussed with md, will adv to soft diet and repeat labs in am. - Cont abx - Daily labs   FEN - Soft diet VTE - SCDs, subq heparin ID - Zosyn   Hypokalemia - replace SOB - resolved, on RA   LOS: 4 days    Jillyn Ledger , [facility] Surgery 10/18/2021, 11:30 AM Please see Amion for pager number during day hours 7:00am-4:30pm

## 2021-10-19 ENCOUNTER — Inpatient Hospital Stay (HOSPITAL_COMMUNITY): Payer: Federal, State, Local not specified - PPO

## 2021-10-19 DIAGNOSIS — R109 Unspecified abdominal pain: Secondary | ICD-10-CM | POA: Diagnosis not present

## 2021-10-19 DIAGNOSIS — I700 Atherosclerosis of aorta: Secondary | ICD-10-CM | POA: Diagnosis not present

## 2021-10-19 LAB — CBC
HCT: 35 % — ABNORMAL LOW (ref 36.0–46.0)
Hemoglobin: 11.6 g/dL — ABNORMAL LOW (ref 12.0–15.0)
MCH: 30.8 pg (ref 26.0–34.0)
MCHC: 33.1 g/dL (ref 30.0–36.0)
MCV: 92.8 fL (ref 80.0–100.0)
Platelets: 281 10*3/uL (ref 150–400)
RBC: 3.77 MIL/uL — ABNORMAL LOW (ref 3.87–5.11)
RDW: 13.8 % (ref 11.5–15.5)
WBC: 14.6 10*3/uL — ABNORMAL HIGH (ref 4.0–10.5)
nRBC: 0 % (ref 0.0–0.2)

## 2021-10-19 LAB — BASIC METABOLIC PANEL
Anion gap: 7 (ref 5–15)
BUN: 11 mg/dL (ref 8–23)
CO2: 26 mmol/L (ref 22–32)
Calcium: 8.5 mg/dL — ABNORMAL LOW (ref 8.9–10.3)
Chloride: 103 mmol/L (ref 98–111)
Creatinine, Ser: 0.76 mg/dL (ref 0.44–1.00)
GFR, Estimated: >60 mL/min (ref >60)
Glucose, Bld: 103 mg/dL — ABNORMAL HIGH (ref 70–99)
Potassium: 4 mmol/L (ref 3.5–5.1)
Sodium: 136 mmol/L (ref 135–145)

## 2021-10-19 LAB — CULTURE, BLOOD (ROUTINE X 2)
Culture: NO GROWTH
Special Requests: ADEQUATE

## 2021-10-19 MED ORDER — ENOXAPARIN SODIUM 40 MG/0.4ML IJ SOSY
40.0000 mg | PREFILLED_SYRINGE | Freq: Every day | INTRAMUSCULAR | Status: DC
  Administered 2021-10-19 – 2021-10-20 (×2): 40 mg via SUBCUTANEOUS
  Filled 2021-10-19 (×2): qty 0.4

## 2021-10-19 MED ORDER — IOHEXOL 300 MG/ML  SOLN
100.0000 mL | Freq: Once | INTRAMUSCULAR | Status: AC | PRN
  Administered 2021-10-19: 100 mL via INTRAVENOUS

## 2021-10-19 MED ORDER — IOHEXOL 9 MG/ML PO SOLN
ORAL | Status: AC
  Administered 2021-10-19: 500 mL
  Filled 2021-10-19: qty 1000

## 2021-10-19 MED ORDER — SODIUM CHLORIDE (PF) 0.9 % IJ SOLN
INTRAMUSCULAR | Status: AC
  Administered 2021-10-19: 10 mL
  Filled 2021-10-19: qty 50

## 2021-10-19 NOTE — Progress Notes (Signed)
Mobility Specialist Cancellation/Refusal Note:    10/19/21 1159  Mobility  Activity Refused mobility    Reason for Cancellation/Refusal: Pt declined mobility at this time. Pt getting ready to heat to CT. Will check back as schedule permits.      [facility]

## 2021-10-19 NOTE — Progress Notes (Signed)
5 Days Post-Op   Subjective/Chief Complaint: Feels much better still having flatus and bms, some llq soreness, tol some diet   Objective: Vital signs in last 24 hours: Temp:  [97.6 F (36.4 C)-98.4 F (36.9 C)] 97.6 F (36.4 C) (09/23 0916) Pulse Rate:  [80-95] 91 (09/23 0916) Resp:  [18-20] 20 (09/23 0510) BP: (138-146)/(78-84) 138/84 (09/23 0916) SpO2:  [95 %-97 %] 95 % (09/23 0916) Last BM Date : 10/18/21  Intake/Output from previous day: 09/22 0701 - 09/23 0700 In: 1530.1 [P.O.:1440; IV Piggyback:90.1] Out: 200 [Urine:200] Intake/Output this shift: No intake/output data recorded.  General nad Cv regular Pulm effort normal Ab mild tender llq , soft nondistended  Lab Results:  Recent Labs    10/18/21 0441 10/19/21 0440  WBC 13.0* 14.6*  HGB 12.5 11.6*  HCT 37.1 35.0*  PLT 218 281   BMET Recent Labs    10/18/21 0441 10/19/21 0440  NA 135 136  K 3.1* 4.0  CL 104 103  CO2 22 26  GLUCOSE 115* 103*  BUN 5* 11  CREATININE 0.61 0.76  CALCIUM 8.1* 8.5*   PT/INR No results for input(s): "LABPROT", "INR" in the last 72 hours. ABG No results for input(s): "PHART", "HCO3" in the last 72 hours.  Invalid input(s): "PCO2", "PO2"  Studies/Results: No results found.  Anti-infectives: Anti-infectives (From admission, onward)    Start     Dose/Rate Route Frequency Ordered Stop   10/15/21 0000  piperacillin-tazobactam (ZOSYN) IVPB 3.375 g        3.375 g 12.5 mL/hr over 240 Minutes Intravenous Every 8 hours 10/14/21 2029     10/14/21 1815  ceFEPIme (MAXIPIME) 2 g in sodium chloride 0.9 % 100 mL IVPB       See Hyperspace for full Linked Orders Report.   2 g 200 mL/hr over 30 Minutes Intravenous  Once 10/14/21 1801 10/14/21 1953   10/14/21 1815  metroNIDAZOLE (FLAGYL) IVPB 500 mg       See Hyperspace for full Linked Orders Report.   500 mg 100 mL/hr over 60 Minutes Intravenous  Once 10/14/21 1801 10/14/21 1953       Assessment/Plan: Pneumoperitoneum  likely perf diverticulitis - Suspect that she perforated left or sigmoid colon on Saturday and improved upon presentation. Currently being watched with conservative management given reassuring exam, normal vitals, normal lactate and normal wbc on presentation. Unclear of source. Could be diverticular or from colitis. Her C. Diff was negative.  - Fever resolved. WBC up slightly again. Needs repeat ct today - Cont abx - Daily labs   FEN - Soft diet VTE - SCDs, subq heparin ID - Zosyn    Rolm Bookbinder 10/19/2021

## 2021-10-20 LAB — CULTURE, BLOOD (ROUTINE X 2)
Culture: NO GROWTH
Culture: NO GROWTH
Special Requests: ADEQUATE

## 2021-10-20 LAB — PROTIME-INR
INR: 1.2 (ref 0.8–1.2)
Prothrombin Time: 15.1 seconds (ref 11.4–15.2)

## 2021-10-20 MED ORDER — ENOXAPARIN SODIUM 40 MG/0.4ML IJ SOSY
40.0000 mg | PREFILLED_SYRINGE | Freq: Every day | INTRAMUSCULAR | Status: DC
  Administered 2021-10-22 – 2021-10-23 (×2): 40 mg via SUBCUTANEOUS
  Filled 2021-10-20 (×2): qty 0.4

## 2021-10-20 MED ORDER — HYDROMORPHONE HCL 1 MG/ML IJ SOLN: 1.0000 mg | INTRAMUSCULAR | Status: DC | PRN

## 2021-10-20 NOTE — Progress Notes (Signed)
6 Days Post-Op   Subjective/Chief Complaint: Feels great this am, having flatus and bm, no real pain today, tol diet, ct yesterday reviewed   Objective: Vital signs in last 24 hours: Temp:  [97.6 F (36.4 C)-99.5 F (37.5 C)] 97.7 F (36.5 C) (09/24 0432) Pulse Rate:  [83-93] 83 (09/24 0432) Resp:  [16-18] 18 (09/24 0432) BP: (136-146)/(72-100) 140/72 (09/24 0432) SpO2:  [94 %-95 %] 94 % (09/24 0432) Last BM Date : 10/19/21  Intake/Output from previous day: 09/23 0701 - 09/24 0700 In: 1285.6 [P.O.:1080; IV Piggyback:205.6] Out: -  Intake/Output this shift: No intake/output data recorded.  General nad Cv regular Pulm effort normal Ab nontender soft nondistended  Lab Results:  Recent Labs    10/18/21 0441 10/19/21 0440  WBC 13.0* 14.6*  HGB 12.5 11.6*  HCT 37.1 35.0*  PLT 218 281   BMET Recent Labs    10/18/21 0441 10/19/21 0440  NA 135 136  K 3.1* 4.0  CL 104 103  CO2 22 26  GLUCOSE 115* 103*  BUN 5* 11  CREATININE 0.61 0.76  CALCIUM 8.1* 8.5*   PT/INR No results for input(s): "LABPROT", "INR" in the last 72 hours. ABG No results for input(s): "PHART", "HCO3" in the last 72 hours.  Invalid input(s): "PCO2", "PO2"  Studies/Results: CT ABDOMEN PELVIS W CONTRAST  Result Date: 10/19/2021 CLINICAL DATA:  Follow-up bowel perforation. Left lower quadrant pain. EXAM: CT ABDOMEN AND PELVIS WITH CONTRAST TECHNIQUE: Multidetector CT imaging of the abdomen and pelvis was performed using the standard protocol following bolus administration of intravenous contrast. RADIATION DOSE REDUCTION: This exam was performed according to the departmental dose-optimization program which includes automated exposure control, adjustment of the mA and/or kV according to patient size and/or use of iterative reconstruction technique. CONTRAST:  OMNIPAQUE IOHEXOL 300 MG/ML  SOLN COMPARISON:  10/14/2021 FINDINGS: Lower Chest: Increased bibasilar atelectasis and tiny pleural  effusions. Hepatobiliary: No hepatic masses identified. Prior cholecystectomy. No evidence of biliary obstruction. Pancreas:  No mass or inflammatory changes. Spleen: Within normal limits in size and appearance. Adrenals/Urinary Tract: No suspicious masses identified. No evidence of ureteral calculi or hydronephrosis. Stomach/Bowel: Small amount of free intraperitoneal air is seen but decreased since previous study, consistent with bowel perforation. Mild-to-moderate sigmoid diverticulitis shows no significant change. Moderate wall thickening is again seen involving the distal portion of the transverse colon with adjacent pericolonic soft tissue stranding. Increased moderate small bowel dilatation and air-fluid levels are seen, with transition point in the right pelvis in the region of sigmoid diverticulitis, consistent with distal small-bowel obstruction. Several small fluid and gas collections are seen in the left lower quadrant, central pelvis, and pelvic cul-de-sac. Several these show increase in size since previous study, largest in the left lower quadrant measuring 7.0 x 3.4 cm on image 82/2. Vascular/Lymphatic: No pathologically enlarged lymph nodes. No acute vascular findings. Aortic atherosclerotic calcification incidentally noted. Reproductive:  Prior hysterectomy noted. Other: Mild perihepatic and perisplenic ascites has increased since previous study. Musculoskeletal:  No suspicious bone lesions identified. IMPRESSION: Decreased free intraperitoneal air since prior exam. This is consistent with bowel perforation, presumably colonic in location. Mild-to-moderate sigmoid diverticulitis, without significant change. Stable moderate wall thickening involving the transverse colon, without definite diverticular disease at this site. This favors infectious or inflammatory colitis, with neoplasm considered less likely. Increased size of several fluid and gas collections in left lower quadrant, central pelvis, and  pelvic cul-de-sac, consistent with abscesses. Worsening distal small bowel obstruction, with transition point in right  pelvis likely secondary to sigmoid diverticulitis. New mild ascites. Increased bibasilar atelectasis and tiny pleural effusions. Aortic Atherosclerosis (ICD10-I70.0). Electronically Signed   By: Marlaine Hind M.D.   On: 10/19/2021 13:49    Anti-infectives: Anti-infectives (From admission, onward)    Start     Dose/Rate Route Frequency Ordered Stop   10/15/21 0000  piperacillin-tazobactam (ZOSYN) IVPB 3.375 g        3.375 g 12.5 mL/hr over 240 Minutes Intravenous Every 8 hours 10/14/21 2029     10/14/21 1815  ceFEPIme (MAXIPIME) 2 g in sodium chloride 0.9 % 100 mL IVPB       See Hyperspace for full Linked Orders Report.   2 g 200 mL/hr over 30 Minutes Intravenous  Once 10/14/21 1801 10/14/21 1953   10/14/21 1815  metroNIDAZOLE (FLAGYL) IVPB 500 mg       See Hyperspace for full Linked Orders Report.   500 mg 100 mL/hr over 60 Minutes Intravenous  Once 10/14/21 1801 10/14/21 1953       Assessment/Plan: Pneumoperitoneum likely perf diverticulitis - Suspect that she perforated left or sigmoid colon on Saturday and improved upon presentation. Currently being watched with conservative management given reassuring exam, normal vitals, normal lactate and normal wbc on presentation. yesterday felt not great and wbc up a little do did ct.  Report and review states sbo (she absolutely does not have this clinically) with several fluid collections and decreased air.  I was prepared to tell her this morning that we needed to at least consider surgery as next step but she is really almost normal today with no tenderness on exam, tol diet and having bowel function. She would like to avoid surgery due to her husband at home and her being caregiver. I am not entirely sure this will be possible.  I told her I would ask IR if anything they could do although not sure they can drain these collections.   Will recheck wbc in am - Cont abx - Daily labs   FEN - Soft diet VTE - SCDs, subq heparin ID - Zosyn     Rolm Bookbinder 10/20/2021

## 2021-10-20 NOTE — Consult Note (Signed)
Chief Complaint: Patient was seen in consultation today for CT guided aspiration/possible drainage of abdomino-pelvic fluid collections  Chief Complaint  Patient presents with   Diarrhea    Referring Physician(s): Darnelle Spangle  Supervising Physician: Malachy Moan  Patient Status: [facility] - In-pt  History of Present Illness: Nicole Wilcox is a 61 y.o. female with past medical history of arthritis, bradycardia, prior hysterectomy, cholecystectomy, hernia repair admitted to [facility] on 9/18 with persistent abdominal pain, watery diarrhea and CT abdomen pelvis revealing:  1. Widespread free intraperitoneal air, most prominent under the anterior right hemidiaphragm, compatible with perforated viscus, probably of colonic origin. No focal measurable fluid collection. 2. Abnormal wall thickening throughout the distal transverse colon and sigmoid colon, with associated pericolonic fat stranding and ill-defined fluid. Underlying moderate sigmoid diverticulosis. Findings may be due to perforated acute diverticulitis with associated reactive wall thickening in the long abnormal segments of left colon, although other causes of colitis such as C diff colitis cannot be excluded. 3. Aortic Atherosclerosis   Patient seen by surgery and been managed conservatively; (If possible she would like to avoid surgery due to her husband at home and her being caregiver).    Follow-up CT abdomen pelvis performed yesterday revealed: Decreased free intraperitoneal air since prior exam. This is consistent with bowel perforation, presumably colonic in location.   Mild-to-moderate sigmoid diverticulitis, without significant change.   Stable moderate wall thickening involving the transverse colon, without definite diverticular disease at this site. This favors infectious or inflammatory colitis, with neoplasm considered less likely.   Increased size of several fluid and gas collections in  left lower quadrant, central pelvis, and pelvic cul-de-sac, consistent with abscesses.   Worsening distal small bowel obstruction, with transition point in right pelvis likely secondary to sigmoid diverticulitis.   New mild ascites. Increased bibasilar atelectasis and tiny pleural effusions.   Aortic Atherosclerosis    She is currently afebrile, BP okay, WBC yesterday 14.6, hemoglobin 11.6, platelets normal, creatinine normal, PT 18.2/INR 1.5; request now received from surgery for consideration of CT-guided abdominal/pelvic fluid collection(s) aspiration/drainage.       Past Surgical History:  Procedure Laterality Date   ABDOMINAL HYSTERECTOMY     CHOLECYSTECTOMY     GALLBLADDER SURGERY     HERNIA REPAIR     HIP SURGERY     S/P 3 separate hip surgeries in 2004   INGUINAL HERNIA REPAIR     TOTAL VAGINAL HYSTERECTOMY      Allergies: Patient has no known allergies.  Medications: Prior to Admission medications   Medication Sig Start Date End Date Taking? Authorizing Provider  acetaminophen (TYLENOL) 650 MG CR tablet Take 650 mg by mouth every 8 (eight) hours as needed for pain.   Yes [provider]  b complex vitamins tablet Take 1 tablet by mouth daily.   Yes [provider]  cholecalciferol (VITAMIN D) 400 UNITS TABS Take 400 Units by mouth daily.   Yes [provider]  Chromium Picolinate 500 MCG TABS Take 1 tablet by mouth daily.   Yes [provider]  DULoxetine (CYMBALTA) 30 MG capsule TAKE 1 CAPSULE(30 MG) BY MOUTH DAILY 07/15/21  Yes Myrlene Broker, MD  Multiple Vitamin (MULTIVITAMIN) tablet Take 1 tablet by mouth daily.   Yes [provider]  Potassium 99 MG TABS Take 1 tablet by mouth daily.   Yes [provider]     Family History  Problem Relation Age of Onset   Heart failure Mother  Asthma Mother    Cancer Father    Diabetes Maternal Grandmother    Hypertension Maternal Grandmother    Stroke  Maternal Grandmother    Heart disease Maternal Grandfather    Cancer Paternal Grandfather    Diabetes Brother     Social History   Socioeconomic History   Marital status: Married    Spouse name: Not on file   Number of children: Not on file   Years of education: Not on file   Highest education level: Not on file  Occupational History   Occupation: retired    Associate Professor: [employer]  Tobacco Use   Smoking status: Former    Packs/day: 1.00    Types: Cigarettes   Smokeless tobacco: Former   Tobacco comments:    quit 5 months ago  Building services engineer Use: Never used  Substance and Sexual Activity   Alcohol use: No   Drug use: No   Sexual activity: Yes    Partners: Male  Other Topics Concern   Not on file  Social History Narrative   Exercises alittle.   Social Determinants of Health   Financial Resource Strain: Not on file  Food Insecurity: No Food Insecurity (10/14/2021)   Hunger Vital Sign    Worried About Running Out of Food in the Last Year: Never true    Ran Out of Food in the Last Year: Never true  Transportation Needs: No Transportation Needs (10/14/2021)   PRAPARE - Administrator, Civil Service (Medical): No    Lack of Transportation (Non-Medical): No  Physical Activity: Not on file  Stress: Not on file  Social Connections: Not on file      Review of Systems currently denies fever, headache, chest pain, cough, back pain, nausea, vomiting or bleeding.  She does have some mild dyspnea and some mild periumbilical discomfort  Vital Signs: BP (!) 140/72 (BP Location: Left Arm) Comment: Notfied RN  Pulse 83   Temp 97.7 F (36.5 C) (Oral)   Resp 18   Ht 5\' 6"  (1.676 m)   Wt 200 lb (90.7 kg)   SpO2 94%   BMI 32.28 kg/m     Physical Exam awake, alert.  Chest with slightly diminished breath sounds bases, more so on right.  Heart with regular rate and rhythm.  Abdomen soft, few bowel sounds, some mildly tender periumbilical tenderness to  palpation. Ext with full range of motion  Imaging: CT ABDOMEN PELVIS W CONTRAST  Result Date: 10/19/2021 CLINICAL DATA:  Follow-up bowel perforation. Left lower quadrant pain. EXAM: CT ABDOMEN AND PELVIS WITH CONTRAST TECHNIQUE: Multidetector CT imaging of the abdomen and pelvis was performed using the standard protocol following bolus administration of intravenous contrast. RADIATION DOSE REDUCTION: This exam was performed according to the departmental dose-optimization program which includes automated exposure control, adjustment of the mA and/or kV according to patient size and/or use of iterative reconstruction technique. CONTRAST:  10/21/2021 OMNIPAQUE IOHEXOL 300 MG/ML  SOLN COMPARISON:  10/14/2021 FINDINGS: Lower Chest: Increased bibasilar atelectasis and tiny pleural effusions. Hepatobiliary: No hepatic masses identified. Prior cholecystectomy. No evidence of biliary obstruction. Pancreas:  No mass or inflammatory changes. Spleen: Within normal limits in size and appearance. Adrenals/Urinary Tract: No suspicious masses identified. No evidence of ureteral calculi or hydronephrosis. Stomach/Bowel: Small amount of free intraperitoneal air is seen but decreased since previous study, consistent with bowel perforation. Mild-to-moderate sigmoid diverticulitis shows no significant change. Moderate wall thickening is again seen involving the distal portion  of the transverse colon with adjacent pericolonic soft tissue stranding. Increased moderate small bowel dilatation and air-fluid levels are seen, with transition point in the right pelvis in the region of sigmoid diverticulitis, consistent with distal small-bowel obstruction. Several small fluid and gas collections are seen in the left lower quadrant, central pelvis, and pelvic cul-de-sac. Several these show increase in size since previous study, largest in the left lower quadrant measuring 7.0 x 3.4 cm on image 82/2. Vascular/Lymphatic: No pathologically enlarged  lymph nodes. No acute vascular findings. Aortic atherosclerotic calcification incidentally noted. Reproductive:  Prior hysterectomy noted. Other: Mild perihepatic and perisplenic ascites has increased since previous study. Musculoskeletal:  No suspicious bone lesions identified. IMPRESSION: Decreased free intraperitoneal air since prior exam. This is consistent with bowel perforation, presumably colonic in location. Mild-to-moderate sigmoid diverticulitis, without significant change. Stable moderate wall thickening involving the transverse colon, without definite diverticular disease at this site. This favors infectious or inflammatory colitis, with neoplasm considered less likely. Increased size of several fluid and gas collections in left lower quadrant, central pelvis, and pelvic cul-de-sac, consistent with abscesses. Worsening distal small bowel obstruction, with transition point in right pelvis likely secondary to sigmoid diverticulitis. New mild ascites. Increased bibasilar atelectasis and tiny pleural effusions. Aortic Atherosclerosis (ICD10-I70.0). Electronically Signed   By: Danae OrleansJohn A Stahl M.D.   On: 10/19/2021 13:49   DG CHEST PORT 1 VIEW  Result Date: 10/17/2021 CLINICAL DATA:  Shortness of breath. EXAM: PORTABLE CHEST 1 VIEW COMPARISON:  None Available. FINDINGS: Cardiomediastinal silhouette is normal. Mediastinal contours appear intact. Left lower lobe atelectasis versus airspace consolidation. No free intra-abdominal gas seen on this semi erect chest x-ray. Osseous structures are without acute abnormality. Soft tissues are grossly normal. IMPRESSION: Left lower lobe atelectasis versus airspace consolidation. Electronically Signed   By: Ted Mcalpineobrinka  Dimitrova M.D.   On: 10/17/2021 09:54   CT ABDOMEN PELVIS W CONTRAST  Result Date: 10/14/2021 CLINICAL DATA:  Left lower quadrant abdominal pain and constipation. History of cholecystectomy and hysterectomy. EXAM: CT ABDOMEN AND PELVIS WITH CONTRAST  TECHNIQUE: Multidetector CT imaging of the abdomen and pelvis was performed using the standard protocol following bolus administration of intravenous contrast. RADIATION DOSE REDUCTION: This exam was performed according to the departmental dose-optimization program which includes automated exposure control, adjustment of the mA and/or kV according to patient size and/or use of iterative reconstruction technique. CONTRAST:  100mL OMNIPAQUE IOHEXOL 300 MG/ML  SOLN COMPARISON:  None Available. FINDINGS: Lower chest: Mild platelike dependent bibasilar scarring versus atelectasis. Hepatobiliary: Normal liver size. No liver mass. Cholecystectomy. Bile ducts are within normal post cholecystectomy limits with CBD diameter 9 mm. Pancreas: Normal, with no mass or duct dilation. Spleen: Normal size. No mass. Adrenals/Urinary Tract: Normal adrenals. Normal kidneys with no hydronephrosis and no renal mass. Normal bladder. Stomach/Bowel: Normal non-distended stomach. Normal caliber small bowel with no small bowel wall thickening. Normal appendix. Oral contrast transits to the distal colon. Moderate sigmoid diverticulosis. Abnormal wall thickening throughout distal transverse colon and throughout the sigmoid colon, with associated pericolonic fat stranding and ill-defined fluid. Scattered foci of pericolonic free air in the left lower quadrant (series 2/image 69). Vascular/Lymphatic: Atherosclerotic nonaneurysmal abdominal aorta. Patent portal, splenic, hepatic and renal veins. No pathologically enlarged lymph nodes in the abdomen or pelvis. Reproductive: Status post hysterectomy, with no abnormal findings at the vaginal cuff. No adnexal mass. Other: Widespread free intraperitoneal air in upper peritoneal cavity, most prominent under the anterior right hemidiaphragm. No focal measurable fluid collection. Small amount of ill-defined  fluid in the pelvic cul-de-sac. Musculoskeletal: No aggressive appearing focal osseous lesions.  Moderate lumbar spondylosis. Left total hip arthroplasty. IMPRESSION: 1. Widespread free intraperitoneal air, most prominent under the anterior right hemidiaphragm, compatible with perforated viscus, probably of colonic origin. No focal measurable fluid collection. 2. Abnormal wall thickening throughout the distal transverse colon and sigmoid colon, with associated pericolonic fat stranding and ill-defined fluid. Underlying moderate sigmoid diverticulosis. Findings may be due to perforated acute diverticulitis with associated reactive wall thickening in the long abnormal segments of left colon, although other causes of colitis such as C diff colitis cannot be excluded. 3. Aortic Atherosclerosis (ICD10-I70.0). Critical Value/emergent results were called by telephone at the time of interpretation on 10/14/2021 at 5:52 pm to provider [facility] , who verbally acknowledged these results. Electronically Signed   By: Ilona Sorrel M.D.   On: 10/14/2021 17:54    Labs:  CBC: Recent Labs    10/16/21 0438 10/17/21 0924 10/18/21 0441 10/19/21 0440  WBC 10.1 10.6* 13.0* 14.6*  HGB 10.6* 11.3* 12.5 11.6*  HCT 32.1* 33.8* 37.1 35.0*  PLT 143* 179 218 281    COAGS: Recent Labs    10/14/21 1933  INR 1.5*  APTT 26    BMP: Recent Labs    10/16/21 0438 10/17/21 0924 10/18/21 0441 10/19/21 0440  NA 138 137 135 136  K 3.1* 2.6* 3.1* 4.0  CL 110 106 104 103  CO2 20* 24 22 26   GLUCOSE 96 114* 115* 103*  BUN 16 9 5* 11  CALCIUM 8.4* 8.0* 8.1* 8.5*  CREATININE 0.75 0.66 0.61 0.76  GFRNONAA >60 >60 >60 >60    LIVER FUNCTION TESTS: Recent Labs    07/23/21 0915 10/14/21 1430  BILITOT 0.4 1.1  AST 25 23  ALT 17 17  ALKPHOS 62 54  PROT 6.8 7.1  ALBUMIN 4.2 3.7    TUMOR MARKERS: No results for input(s): "AFPTM", "CEA", "CA199", "CHROMGRNA" in the last 8760 hours.  Assessment and Plan: 61 y.o. female with past medical history of arthritis, bradycardia, prior hysterectomy,  cholecystectomy, hernia repair admitted to [facility] on 9/18 with persistent abdominal pain, watery diarrhea and CT abdomen pelvis revealing:  1. Widespread free intraperitoneal air, most prominent under the anterior right hemidiaphragm, compatible with perforated viscus, probably of colonic origin. No focal measurable fluid collection. 2. Abnormal wall thickening throughout the distal transverse colon and sigmoid colon, with associated pericolonic fat stranding and ill-defined fluid. Underlying moderate sigmoid diverticulosis. Findings may be due to perforated acute diverticulitis with associated reactive wall thickening in the long abnormal segments of left colon, although other causes of colitis such as C diff colitis cannot be excluded. 3. Aortic Atherosclerosis   Patient seen by surgery and been managed conservatively; (If possible she would like to avoid surgery due to her husband at home and her being caregiver).    Follow-up CT abdomen pelvis performed yesterday revealed: Decreased free intraperitoneal air since prior exam. This is consistent with bowel perforation, presumably colonic in location.   Mild-to-moderate sigmoid diverticulitis, without significant change.   Stable moderate wall thickening involving the transverse colon, without definite diverticular disease at this site. This favors infectious or inflammatory colitis, with neoplasm considered less likely.   Increased size of several fluid and gas collections in left lower quadrant, central pelvis, and pelvic cul-de-sac, consistent with abscesses.   Worsening distal small bowel obstruction, with transition point in right pelvis likely secondary to sigmoid diverticulitis.   New mild ascites. Increased bibasilar  atelectasis and tiny pleural effusions.   Aortic Atherosclerosis    She is currently afebrile, BP okay, WBC yesterday 14.6, hemoglobin 11.6, platelets normal, creatinine normal, PT 18.2/INR  1.5; request now received from surgery for consideration of CT-guided abdominal/pelvic fluid collection(s) aspiration/drainage.  Imaging studies have been reviewed by Dr. Archer Asa and case discussed with Dr. Dwain Sarna. Risks and benefits discussed with the patient including bleeding, infection, damage to adjacent structures, bowel perforation/fistula connection, and sepsis.  All of the patient's questions were answered, patient is agreeable to proceed. Consent signed and in chart; with patient's current stability will tentatively plan case for early next week(9/25 or 9/26)    Thank you for this interesting consult.  I greatly enjoyed meeting Nicole Wilcox and look forward to participating in their care.  A copy of this report was sent to the requesting provider on this date.  Electronically Signed: D. Jeananne Rama, PA-C 10/20/2021, 10:32 AM   I spent a total of 25 minutes    in face to face in clinical consultation, greater than 50% of which was counseling/coordinating care for CT-guided aspiration/possible drainage of abdominal/pelvic fluid collections

## 2021-10-21 ENCOUNTER — Inpatient Hospital Stay (HOSPITAL_COMMUNITY): Payer: Federal, State, Local not specified - PPO

## 2021-10-21 DIAGNOSIS — K631 Perforation of intestine (nontraumatic): Secondary | ICD-10-CM | POA: Diagnosis not present

## 2021-10-21 DIAGNOSIS — K668 Other specified disorders of peritoneum: Secondary | ICD-10-CM | POA: Diagnosis not present

## 2021-10-21 DIAGNOSIS — K651 Peritoneal abscess: Secondary | ICD-10-CM | POA: Diagnosis not present

## 2021-10-21 LAB — BASIC METABOLIC PANEL
Anion gap: 8 (ref 5–15)
BUN: 9 mg/dL (ref 8–23)
CO2: 28 mmol/L (ref 22–32)
Calcium: 8.4 mg/dL — ABNORMAL LOW (ref 8.9–10.3)
Chloride: 99 mmol/L (ref 98–111)
Creatinine, Ser: 0.73 mg/dL (ref 0.44–1.00)
GFR, Estimated: >60 mL/min (ref >60)
Glucose, Bld: 96 mg/dL (ref 70–99)
Potassium: 3.4 mmol/L — ABNORMAL LOW (ref 3.5–5.1)
Sodium: 135 mmol/L (ref 135–145)

## 2021-10-21 LAB — CBC
HCT: 31.2 % — ABNORMAL LOW (ref 36.0–46.0)
Hemoglobin: 10.4 g/dL — ABNORMAL LOW (ref 12.0–15.0)
MCH: 31.5 pg (ref 26.0–34.0)
MCHC: 33.3 g/dL (ref 30.0–36.0)
MCV: 94.5 fL (ref 80.0–100.0)
Platelets: 352 10*3/uL (ref 150–400)
RBC: 3.3 MIL/uL — ABNORMAL LOW (ref 3.87–5.11)
RDW: 13.6 % (ref 11.5–15.5)
WBC: 14.8 10*3/uL — ABNORMAL HIGH (ref 4.0–10.5)
nRBC: 0 % (ref 0.0–0.2)

## 2021-10-21 LAB — MAGNESIUM: Magnesium: 2 mg/dL (ref 1.7–2.4)

## 2021-10-21 MED ORDER — FENTANYL CITRATE (PF) 100 MCG/2ML IJ SOLN
INTRAMUSCULAR | Status: AC | PRN
  Administered 2021-10-21: 50 ug via INTRAVENOUS

## 2021-10-21 MED ORDER — NALOXONE HCL 0.4 MG/ML IJ SOLN
INTRAMUSCULAR | Status: AC
  Filled 2021-10-21: qty 1

## 2021-10-21 MED ORDER — FLUMAZENIL 0.5 MG/5ML IV SOLN
INTRAVENOUS | Status: AC
  Filled 2021-10-21: qty 5

## 2021-10-21 MED ORDER — SODIUM CHLORIDE 0.9% FLUSH
5.0000 mL | Freq: Three times a day (TID) | INTRAVENOUS | Status: DC
  Administered 2021-10-21 – 2021-10-23 (×7): 5 mL

## 2021-10-21 MED ORDER — MIDAZOLAM HCL 2 MG/2ML IJ SOLN
INTRAMUSCULAR | Status: AC | PRN
  Administered 2021-10-21: 1 mg via INTRAVENOUS

## 2021-10-21 MED ORDER — LIDOCAINE HCL 1 % IJ SOLN
INTRAMUSCULAR | Status: AC | PRN
  Administered 2021-10-21: 10 mL via INTRADERMAL

## 2021-10-21 MED ORDER — FENTANYL CITRATE (PF) 100 MCG/2ML IJ SOLN
INTRAMUSCULAR | Status: AC
  Filled 2021-10-21: qty 4

## 2021-10-21 MED ORDER — SCOPOLAMINE 1 MG/3DAYS TD PT72
1.0000 | MEDICATED_PATCH | TRANSDERMAL | Status: DC
  Filled 2021-10-21: qty 1

## 2021-10-21 MED ORDER — IOHEXOL 350 MG/ML SOLN
100.0000 mL | Freq: Once | INTRAVENOUS | Status: AC | PRN
  Administered 2021-10-21: 100 mL via INTRAVENOUS

## 2021-10-21 MED ORDER — MIDAZOLAM HCL 2 MG/2ML IJ SOLN
INTRAMUSCULAR | Status: AC
  Filled 2021-10-21: qty 4

## 2021-10-21 NOTE — Progress Notes (Signed)
7 Days Post-Op   Subjective/Chief Complaint: Feels great this am, having flatus and bm, no real pain today, tol diet   Objective: Vital signs in last 24 hours: Temp:  [98 F (36.7 C)-98.9 F (37.2 C)] 98.3 F (36.8 C) (09/25 0421) Pulse Rate:  [81-90] 81 (09/25 0421) Resp:  [16-18] 16 (09/25 0421) BP: (146-157)/(77-87) 157/87 (09/25 0421) SpO2:  [94 %-96 %] 94 % (09/25 0421) Last BM Date : 10/20/21  Intake/Output from previous day: 09/24 0701 - 09/25 0700 In: 142.4 [IV Piggyback:142.4] Out: -  Intake/Output this shift: Total I/O In: 17.9 [IV Piggyback:17.9] Out: -   General nad Cv regular Pulm effort normal Ab nontender soft nondistended  Lab Results:  Recent Labs    10/19/21 0440 10/21/21 0413  WBC 14.6* 14.8*  HGB 11.6* 10.4*  HCT 35.0* 31.2*  PLT 281 352    BMET Recent Labs    10/19/21 0440 10/21/21 0413  NA 136 135  K 4.0 3.4*  CL 103 99  CO2 26 28  GLUCOSE 103* 96  BUN 11 9  CREATININE 0.76 0.73  CALCIUM 8.5* 8.4*    PT/INR Recent Labs    10/20/21 1047  LABPROT 15.1  INR 1.2   ABG No results for input(s): "PHART", "HCO3" in the last 72 hours.  Invalid input(s): "PCO2", "PO2"  Studies/Results: CT ABDOMEN PELVIS W CONTRAST  Result Date: 10/19/2021 CLINICAL DATA:  Follow-up bowel perforation. Left lower quadrant pain. EXAM: CT ABDOMEN AND PELVIS WITH CONTRAST TECHNIQUE: Multidetector CT imaging of the abdomen and pelvis was performed using the standard protocol following bolus administration of intravenous contrast. RADIATION DOSE REDUCTION: This exam was performed according to the departmental dose-optimization program which includes automated exposure control, adjustment of the mA and/or kV according to patient size and/or use of iterative reconstruction technique. CONTRAST:  186mL OMNIPAQUE IOHEXOL 300 MG/ML  SOLN COMPARISON:  10/14/2021 FINDINGS: Lower Chest: Increased bibasilar atelectasis and tiny pleural effusions. Hepatobiliary: No  hepatic masses identified. Prior cholecystectomy. No evidence of biliary obstruction. Pancreas:  No mass or inflammatory changes. Spleen: Within normal limits in size and appearance. Adrenals/Urinary Tract: No suspicious masses identified. No evidence of ureteral calculi or hydronephrosis. Stomach/Bowel: Small amount of free intraperitoneal air is seen but decreased since previous study, consistent with bowel perforation. Mild-to-moderate sigmoid diverticulitis shows no significant change. Moderate wall thickening is again seen involving the distal portion of the transverse colon with adjacent pericolonic soft tissue stranding. Increased moderate small bowel dilatation and air-fluid levels are seen, with transition point in the right pelvis in the region of sigmoid diverticulitis, consistent with distal small-bowel obstruction. Several small fluid and gas collections are seen in the left lower quadrant, central pelvis, and pelvic cul-de-sac. Several these show increase in size since previous study, largest in the left lower quadrant measuring 7.0 x 3.4 cm on image 82/2. Vascular/Lymphatic: No pathologically enlarged lymph nodes. No acute vascular findings. Aortic atherosclerotic calcification incidentally noted. Reproductive:  Prior hysterectomy noted. Other: Mild perihepatic and perisplenic ascites has increased since previous study. Musculoskeletal:  No suspicious bone lesions identified. IMPRESSION: Decreased free intraperitoneal air since prior exam. This is consistent with bowel perforation, presumably colonic in location. Mild-to-moderate sigmoid diverticulitis, without significant change. Stable moderate wall thickening involving the transverse colon, without definite diverticular disease at this site. This favors infectious or inflammatory colitis, with neoplasm considered less likely. Increased size of several fluid and gas collections in left lower quadrant, central pelvis, and pelvic cul-de-sac,  consistent with abscesses. Worsening distal small  bowel obstruction, with transition point in right pelvis likely secondary to sigmoid diverticulitis. New mild ascites. Increased bibasilar atelectasis and tiny pleural effusions. Aortic Atherosclerosis (ICD10-I70.0). Electronically Signed   By: Danae Orleans M.D.   On: 10/19/2021 13:49    Anti-infectives: Anti-infectives (From admission, onward)    Start     Dose/Rate Route Frequency Ordered Stop   10/15/21 0000  piperacillin-tazobactam (ZOSYN) IVPB 3.375 g        3.375 g 12.5 mL/hr over 240 Minutes Intravenous Every 8 hours 10/14/21 2029     10/14/21 1815  ceFEPIme (MAXIPIME) 2 g in sodium chloride 0.9 % 100 mL IVPB       See Hyperspace for full Linked Orders Report.   2 g 200 mL/hr over 30 Minutes Intravenous  Once 10/14/21 1801 10/14/21 1953   10/14/21 1815  metroNIDAZOLE (FLAGYL) IVPB 500 mg       See Hyperspace for full Linked Orders Report.   500 mg 100 mL/hr over 60 Minutes Intravenous  Once 10/14/21 1801 10/14/21 1953       Assessment/Plan: Pneumoperitoneum likely perf diverticulitis - Suspect that she perforated left or sigmoid colon on Saturday and improved upon presentation. Currently being watched with conservative management given reassuring exam, normal vitals, normal lactate and normal wbc on presentation. CT shows several fluid collections and decreased air with still elevated wbc.  She would like to avoid surgery due to her husband at home and her being caregiver.  IR was consulted and are willing to try CT guided drain placement x2 - Cont abx - Daily labs   FEN - Soft diet (NPO currently for procedure) VTE - SCDs, subq heparin ID - Zosyn     Vanita Panda 10/21/2021

## 2021-10-21 NOTE — Procedures (Signed)
Interventional Radiology Procedure Note  Procedure:  10.2 fr drain placed in midline pelvic abscess 10.2 fr drain placed in left pelvic abscess  Indication: Multiple pelvic abscesses  Findings: Please refer to procedural dictation for full description.  Complications: None  EBL: < 10 mL  Miachel Roux, MD [phone]

## 2021-10-21 NOTE — Progress Notes (Signed)
MEDICATION-RELATED CONSULT NOTE   IR Procedure Consult - Anticoagulant/Antiplatelet PTA/Inpatient Med List Review by Pharmacist    Procedure: drain placed in midline pelvic abscess & in left pelvic abscess    Completed: 10/21/21 at 12:41  Post-Procedural bleeding risk per IR MD assessment:  standard  Antithrombotic medications on inpatient or PTA profile prior to procedure:   lovenox 40mg  SQ q24h    Recommended restart time per IR Post-Procedure Guidelines:   Day + 1 (Next AM)     Other considerations:      Plan:    resume Lovenox 40mg  SQ q24h tomorrow at 10AM as already ordered   Peggyann Juba, PharmD, [place]: [phone] 10/21/2021 12:46 PM

## 2021-10-22 DIAGNOSIS — K668 Other specified disorders of peritoneum: Secondary | ICD-10-CM | POA: Diagnosis not present

## 2021-10-22 NOTE — Progress Notes (Signed)
Mobility Specialist - Progress Note   10/22/21 1553  Mobility  HOB Elevated/Bed Position Self regulated  Activity Ambulated independently in hallway  Range of Motion/Exercises Active  Level of Assistance Independent  Assistive Device None  Distance Ambulated (ft) 500 ft  Activity Response Tolerated well  Transport method Ambulatory  $Mobility charge 1 Mobility   Pt received in recliner and agreeable to mobility. C/o abdomen pain during mobility. Pt to bathroom after session with all needs met.     [facility]

## 2021-10-22 NOTE — Progress Notes (Signed)
Mobility Specialist - Progress Note   10/22/21 1105  Mobility  HOB Elevated/Bed Position Self regulated  Activity Ambulated independently in hallway  Range of Motion/Exercises Active  Level of Assistance Independent  Assistive Device None  Distance Ambulated (ft) 450 ft  Activity Response Tolerated well  Transport method Ambulatory  $Mobility charge 1 Mobility   Pt received in recliner and agreeable to mobility. No complaints during ambulation. Pt to recliner after session with all needs met.    [facility]

## 2021-10-22 NOTE — Progress Notes (Signed)
8 Days Post-Op   Subjective/Chief Complaint: Feels good this am, having flatus and bm, tol diet, s/p IR drain placement x2 yesterday   Objective: Vital signs in last 24 hours: Temp:  [98.1 F (36.7 C)-98.2 F (36.8 C)] 98.2 F (36.8 C) (09/26 0406) Pulse Rate:  [71-91] 71 (09/26 0406) Resp:  [16-21] 20 (09/26 0406) BP: (143-170)/(79-96) 154/79 (09/26 0406) SpO2:  [94 %-98 %] 94 % (09/26 0406) Last BM Date : 10/21/21  Intake/Output from previous day: 09/25 0701 - 09/26 0700 In: 1021.9 [P.O.:840; I.V.:5; IV Piggyback:146.9] Out: 152.5 [Drains:152.5] Intake/Output this shift: No intake/output data recorded.  General nad Cv regular Pulm effort normal Ab nontender soft nondistended Drain output serous, slightly cloudy Lab Results:  Recent Labs    10/21/21 0413  WBC 14.8*  HGB 10.4*  HCT 31.2*  PLT 352    BMET Recent Labs    10/21/21 0413  NA 135  K 3.4*  CL 99  CO2 28  GLUCOSE 96  BUN 9  CREATININE 0.73  CALCIUM 8.4*    PT/INR Recent Labs    10/20/21 1047  LABPROT 15.1  INR 1.2    ABG No results for input(s): "PHART", "HCO3" in the last 72 hours.  Invalid input(s): "PCO2", "PO2"  Studies/Results: CT GUIDED PERITONEAL/RETROPERITONEAL FLUID DRAIN BY PERC CATH  Result Date: 10/21/2021 INDICATION: 61 year old woman with bowel perforation and multiple pelvic abscesses presents to IR for CT-guided drain placement EXAM: 1. CT-guided midline pelvic abscess drain placement 2. CT-guided left pelvic abscess drain placement TECHNIQUE: Multidetector CT imaging of the pelvis was performed following the standard protocol with IV contrast. RADIATION DOSE REDUCTION: This exam was performed according to the departmental dose-optimization program which includes automated exposure control, adjustment of the mA and/or kV according to patient size and/or use of iterative reconstruction technique. MEDICATIONS: The patient is currently admitted to the hospital and receiving  intravenous antibiotics. The antibiotics were administered within an appropriate time frame prior to the initiation of the procedure. ANESTHESIA/SEDATION: Moderate (conscious) sedation was employed during this procedure. A total of Versed 3 mg and Fentanyl 200 mcg was administered intravenously by the radiology nurse. Total intra-service moderate Sedation Time: 39 minutes. The patient's level of consciousness and vital signs were monitored continuously by radiology nursing throughout the procedure under my direct supervision. COMPLICATIONS: None immediate. PROCEDURE: Informed written consent was obtained from the patient after a thorough discussion of the procedural risks, benefits and alternatives. All questions were addressed. Maximal Sterile Barrier Technique was utilized including caps, mask, sterile gowns, sterile gloves, sterile drape, hand hygiene and skin antiseptic. A timeout was performed prior to the initiation of the procedure. Contrast enhanced CTA of the pelvis was performed in order to delineate the position of the left inferior epigastric artery. The anterior pelvic wall skin was prepped and draped in the usual fashion. Following local lidocaine administration, the midline abscess was accessed with a 19 gauge Yueh needle utilizing CT guidance. The catheter was exchanged for 10.2 [place] multipurpose pigtail drain over 0.035 inch guidewire. Drain secured to skin with suture and connected to bulb suction. The left pelvic abscess was then accessed with 19 gauge Yueh needle utilized CT guidance. The Yueh catheter was exchanged for 10.2 [place] multipurpose pigtail drain over 0.035 inch guidewire. Drain secured to skin with suture and connected to bulb suction. 5 mL sample from the midline pelvic abscess was sent for Gram stain and culture. IMPRESSION: 1. 10.2 [place] multipurpose pigtail drain placed in midline pelvic abscess. 2. 10.2  [place] multipurpose pigtail drain placed in left pelvic abscess.  Electronically Signed   By: Miachel Roux M.D.   On: 10/21/2021 13:44   CT GUIDED PERITONEAL/RETROPERITONEAL FLUID DRAIN BY PERC CATH  Result Date: 10/21/2021 INDICATION: 61 year old woman with bowel perforation and multiple pelvic abscesses presents to IR for CT-guided drain placement EXAM: 1. CT-guided midline pelvic abscess drain placement 2. CT-guided left pelvic abscess drain placement TECHNIQUE: Multidetector CT imaging of the pelvis was performed following the standard protocol with IV contrast. RADIATION DOSE REDUCTION: This exam was performed according to the departmental dose-optimization program which includes automated exposure control, adjustment of the mA and/or kV according to patient size and/or use of iterative reconstruction technique. MEDICATIONS: The patient is currently admitted to the hospital and receiving intravenous antibiotics. The antibiotics were administered within an appropriate time frame prior to the initiation of the procedure. ANESTHESIA/SEDATION: Moderate (conscious) sedation was employed during this procedure. A total of Versed 3 mg and Fentanyl 200 mcg was administered intravenously by the radiology nurse. Total intra-service moderate Sedation Time: 39 minutes. The patient's level of consciousness and vital signs were monitored continuously by radiology nursing throughout the procedure under my direct supervision. COMPLICATIONS: None immediate. PROCEDURE: Informed written consent was obtained from the patient after a thorough discussion of the procedural risks, benefits and alternatives. All questions were addressed. Maximal Sterile Barrier Technique was utilized including caps, mask, sterile gowns, sterile gloves, sterile drape, hand hygiene and skin antiseptic. A timeout was performed prior to the initiation of the procedure. Contrast enhanced CTA of the pelvis was performed in order to delineate the position of the left inferior epigastric artery. The anterior pelvic wall  skin was prepped and draped in the usual fashion. Following local lidocaine administration, the midline abscess was accessed with a 19 gauge Yueh needle utilizing CT guidance. The catheter was exchanged for 10.2 [place] multipurpose pigtail drain over 0.035 inch guidewire. Drain secured to skin with suture and connected to bulb suction. The left pelvic abscess was then accessed with 19 gauge Yueh needle utilized CT guidance. The Yueh catheter was exchanged for 10.2 [place] multipurpose pigtail drain over 0.035 inch guidewire. Drain secured to skin with suture and connected to bulb suction. 5 mL sample from the midline pelvic abscess was sent for Gram stain and culture. IMPRESSION: 1. 10.2 French multipurpose pigtail drain placed in midline pelvic abscess. 2. 10.2 French multipurpose pigtail drain placed in left pelvic abscess. Electronically Signed   By: Miachel Roux M.D.   On: 10/21/2021 13:44    Anti-infectives: Anti-infectives (From admission, onward)    Start     Dose/Rate Route Frequency Ordered Stop   10/15/21 0000  piperacillin-tazobactam (ZOSYN) IVPB 3.375 g        3.375 g 12.5 mL/hr over 240 Minutes Intravenous Every 8 hours 10/14/21 2029     10/14/21 1815  ceFEPIme (MAXIPIME) 2 g in sodium chloride 0.9 % 100 mL IVPB       See Hyperspace for full Linked Orders Report.   2 g 200 mL/hr over 30 Minutes Intravenous  Once 10/14/21 1801 10/14/21 1953   10/14/21 1815  metroNIDAZOLE (FLAGYL) IVPB 500 mg       See Hyperspace for full Linked Orders Report.   500 mg 100 mL/hr over 60 Minutes Intravenous  Once 10/14/21 1801 10/14/21 1953       Assessment/Plan: Pneumoperitoneum likely perf diverticulitis - Suspect that she perforated left or sigmoid colon on Saturday and improved upon presentation. Currently being watched with conservative  management given reassuring exam, normal vitals, normal lactate and normal wbc on presentation. CT shows several fluid collections and decreased air with still  elevated wbc.  She would like to avoid surgery due to her husband at home and her being caregiver.  IR placed CT guided drain x2 9/25 - Cont abx for now.  Gram stain shows GP rods - Daily labs   FEN - Soft diet  VTE - SCDs, subq heparin ID - Zosyn     Nicole Wilcox 10/22/2021

## 2021-10-22 NOTE — Progress Notes (Signed)
Referring Physician(s): Darnelle Spangle  Supervising Physician: Marliss Coots  Patient Status:  [facility] - In-pt  Chief Complaint: Pelvic abscesses   Subjective: Pt feeling better since pelvic drains placed yesterday; denies fever/chills/worsening abd pain, N/V; does report some urinary incontinence   Allergies: Patient has no known allergies.  Medications: Prior to Admission medications   Medication Sig Start Date End Date Taking? Authorizing Provider  acetaminophen (TYLENOL) 650 MG CR tablet Take 650 mg by mouth every 8 (eight) hours as needed for pain.   Yes [provider]  b complex vitamins tablet Take 1 tablet by mouth daily.   Yes [provider]  cholecalciferol (VITAMIN D) 400 UNITS TABS Take 400 Units by mouth daily.   Yes [provider]  Chromium Picolinate 500 MCG TABS Take 1 tablet by mouth daily.   Yes [provider]  DULoxetine (CYMBALTA) 30 MG capsule TAKE 1 CAPSULE(30 MG) BY MOUTH DAILY 07/15/21  Yes Myrlene Broker, MD  Multiple Vitamin (MULTIVITAMIN) tablet Take 1 tablet by mouth daily.   Yes [provider]  Potassium 99 MG TABS Take 1 tablet by mouth daily.   Yes [provider]     Vital Signs: BP (!) 154/79 (BP Location: Left Arm)   Pulse 71   Temp 98.2 F (36.8 C) (Oral)   Resp 20   Ht 5\' 6"  (1.676 m)   Wt 200 lb (90.7 kg)   SpO2 94%   BMI 32.28 kg/m   Physical Exam:  awake/alert; ant lower abd drains intact, OP 10-15 cc today, 70-85 cc yesterday serous fluid; drains flushed this am  Imaging: CT GUIDED PERITONEAL/RETROPERITONEAL FLUID DRAIN BY PERC CATH  Result Date: 10/21/2021 INDICATION: 61 year old woman with bowel perforation and multiple pelvic abscesses presents to IR for CT-guided drain placement EXAM: 1. CT-guided midline pelvic abscess drain placement 2. CT-guided left pelvic abscess drain placement TECHNIQUE: Multidetector CT imaging of the pelvis was performed following the  standard protocol with IV contrast. RADIATION DOSE REDUCTION: This exam was performed according to the departmental dose-optimization program which includes automated exposure control, adjustment of the mA and/or kV according to patient size and/or use of iterative reconstruction technique. MEDICATIONS: The patient is currently admitted to the hospital and receiving intravenous antibiotics. The antibiotics were administered within an appropriate time frame prior to the initiation of the procedure. ANESTHESIA/SEDATION: Moderate (conscious) sedation was employed during this procedure. A total of Versed 3 mg and Fentanyl 200 mcg was administered intravenously by the radiology nurse. Total intra-service moderate Sedation Time: 39 minutes. The patient's level of consciousness and vital signs were monitored continuously by radiology nursing throughout the procedure under my direct supervision. COMPLICATIONS: None immediate. PROCEDURE: Informed written consent was obtained from the patient after a thorough discussion of the procedural risks, benefits and alternatives. All questions were addressed. Maximal Sterile Barrier Technique was utilized including caps, mask, sterile gowns, sterile gloves, sterile drape, hand hygiene and skin antiseptic. A timeout was performed prior to the initiation of the procedure. Contrast enhanced CTA of the pelvis was performed in order to delineate the position of the left inferior epigastric artery. The anterior pelvic wall skin was prepped and draped in the usual fashion. Following local lidocaine administration, the midline abscess was accessed with a 19 gauge Yueh needle utilizing CT guidance. The catheter was exchanged for 10.2 [place] multipurpose pigtail drain over 0.035 inch guidewire. Drain secured to skin with suture and connected to bulb suction. The left pelvic abscess was then accessed with  19 gauge Yueh needle utilized CT guidance. The Yueh catheter was exchanged for 10.2 [place]  multipurpose pigtail drain over 0.035 inch guidewire. Drain secured to skin with suture and connected to bulb suction. 5 mL sample from the midline pelvic abscess was sent for Gram stain and culture. IMPRESSION: 1. 10.2 [place] multipurpose pigtail drain placed in midline pelvic abscess. 2. 10.2 [place] multipurpose pigtail drain placed in left pelvic abscess. Electronically Signed   By: Miachel Roux M.D.   On: 10/21/2021 13:44   CT GUIDED PERITONEAL/RETROPERITONEAL FLUID DRAIN BY PERC CATH  Result Date: 10/21/2021 INDICATION: 61 year old woman with bowel perforation and multiple pelvic abscesses presents to IR for CT-guided drain placement EXAM: 1. CT-guided midline pelvic abscess drain placement 2. CT-guided left pelvic abscess drain placement TECHNIQUE: Multidetector CT imaging of the pelvis was performed following the standard protocol with IV contrast. RADIATION DOSE REDUCTION: This exam was performed according to the departmental dose-optimization program which includes automated exposure control, adjustment of the mA and/or kV according to patient size and/or use of iterative reconstruction technique. MEDICATIONS: The patient is currently admitted to the hospital and receiving intravenous antibiotics. The antibiotics were administered within an appropriate time frame prior to the initiation of the procedure. ANESTHESIA/SEDATION: Moderate (conscious) sedation was employed during this procedure. A total of Versed 3 mg and Fentanyl 200 mcg was administered intravenously by the radiology nurse. Total intra-service moderate Sedation Time: 39 minutes. The patient's level of consciousness and vital signs were monitored continuously by radiology nursing throughout the procedure under my direct supervision. COMPLICATIONS: None immediate. PROCEDURE: Informed written consent was obtained from the patient after a thorough discussion of the procedural risks, benefits and alternatives. All questions were addressed.  Maximal Sterile Barrier Technique was utilized including caps, mask, sterile gowns, sterile gloves, sterile drape, hand hygiene and skin antiseptic. A timeout was performed prior to the initiation of the procedure. Contrast enhanced CTA of the pelvis was performed in order to delineate the position of the left inferior epigastric artery. The anterior pelvic wall skin was prepped and draped in the usual fashion. Following local lidocaine administration, the midline abscess was accessed with a 19 gauge Yueh needle utilizing CT guidance. The catheter was exchanged for 10.2 [place] multipurpose pigtail drain over 0.035 inch guidewire. Drain secured to skin with suture and connected to bulb suction. The left pelvic abscess was then accessed with 19 gauge Yueh needle utilized CT guidance. The Yueh catheter was exchanged for 10.2 [place] multipurpose pigtail drain over 0.035 inch guidewire. Drain secured to skin with suture and connected to bulb suction. 5 mL sample from the midline pelvic abscess was sent for Gram stain and culture. IMPRESSION: 1. 10.2 [place] multipurpose pigtail drain placed in midline pelvic abscess. 2. 10.2 [place] multipurpose pigtail drain placed in left pelvic abscess. Electronically Signed   By: Miachel Roux M.D.   On: 10/21/2021 13:44   CT ABDOMEN PELVIS W CONTRAST  Result Date: 10/19/2021 CLINICAL DATA:  Follow-up bowel perforation. Left lower quadrant pain. EXAM: CT ABDOMEN AND PELVIS WITH CONTRAST TECHNIQUE: Multidetector CT imaging of the abdomen and pelvis was performed using the standard protocol following bolus administration of intravenous contrast. RADIATION DOSE REDUCTION: This exam was performed according to the departmental dose-optimization program which includes automated exposure control, adjustment of the mA and/or kV according to patient size and/or use of iterative reconstruction technique. CONTRAST:  18mL OMNIPAQUE IOHEXOL 300 MG/ML  SOLN COMPARISON:  10/14/2021 FINDINGS:  Lower Chest: Increased bibasilar atelectasis and tiny pleural effusions. Hepatobiliary:  No hepatic masses identified. Prior cholecystectomy. No evidence of biliary obstruction. Pancreas:  No mass or inflammatory changes. Spleen: Within normal limits in size and appearance. Adrenals/Urinary Tract: No suspicious masses identified. No evidence of ureteral calculi or hydronephrosis. Stomach/Bowel: Small amount of free intraperitoneal air is seen but decreased since previous study, consistent with bowel perforation. Mild-to-moderate sigmoid diverticulitis shows no significant change. Moderate wall thickening is again seen involving the distal portion of the transverse colon with adjacent pericolonic soft tissue stranding. Increased moderate small bowel dilatation and air-fluid levels are seen, with transition point in the right pelvis in the region of sigmoid diverticulitis, consistent with distal small-bowel obstruction. Several small fluid and gas collections are seen in the left lower quadrant, central pelvis, and pelvic cul-de-sac. Several these show increase in size since previous study, largest in the left lower quadrant measuring 7.0 x 3.4 cm on image 82/2. Vascular/Lymphatic: No pathologically enlarged lymph nodes. No acute vascular findings. Aortic atherosclerotic calcification incidentally noted. Reproductive:  Prior hysterectomy noted. Other: Mild perihepatic and perisplenic ascites has increased since previous study. Musculoskeletal:  No suspicious bone lesions identified. IMPRESSION: Decreased free intraperitoneal air since prior exam. This is consistent with bowel perforation, presumably colonic in location. Mild-to-moderate sigmoid diverticulitis, without significant change. Stable moderate wall thickening involving the transverse colon, without definite diverticular disease at this site. This favors infectious or inflammatory colitis, with neoplasm considered less likely. Increased size of several fluid  and gas collections in left lower quadrant, central pelvis, and pelvic cul-de-sac, consistent with abscesses. Worsening distal small bowel obstruction, with transition point in right pelvis likely secondary to sigmoid diverticulitis. New mild ascites. Increased bibasilar atelectasis and tiny pleural effusions. Aortic Atherosclerosis (ICD10-I70.0). Electronically Signed   By: Danae Orleans M.D.   On: 10/19/2021 13:49    Labs:  CBC: Recent Labs    10/17/21 0924 10/18/21 0441 10/19/21 0440 10/21/21 0413  WBC 10.6* 13.0* 14.6* 14.8*  HGB 11.3* 12.5 11.6* 10.4*  HCT 33.8* 37.1 35.0* 31.2*  PLT 179 218 281 352    COAGS: Recent Labs    10/14/21 1933 10/20/21 1047  INR 1.5* 1.2  APTT 26  --     BMP: Recent Labs    10/17/21 0924 10/18/21 0441 10/19/21 0440 10/21/21 0413  NA 137 135 136 135  K 2.6* 3.1* 4.0 3.4*  CL 106 104 103 99  CO2 24 22 26 28   GLUCOSE 114* 115* 103* 96  BUN 9 5* 11 9  CALCIUM 8.0* 8.1* 8.5* 8.4*  CREATININE 0.66 0.61 0.76 0.73  GFRNONAA >60 >60 >60 >60    LIVER FUNCTION TESTS: Recent Labs    07/23/21 0915 10/14/21 1430  BILITOT 0.4 1.1  AST 25 23  ALT 17 17  ALKPHOS 62 54  PROT 6.8 7.1  ALBUMIN 4.2 3.7    Assessment and Plan: Pt with hx pneumoperitoneum/likely perf diverticulitis with pelvic abscesses; s/p midline and left pelvic drain placements on 9/25; afebrile; no new labs; cx pend  Drain Location: ant lower abd midline region Size: Fr size: 10 Fr Date of placement: 10/21/21  Currently to: Drain collection device: suction bulb 24 hour output:  Output by Drain (mL) 10/20/21 0701 - 10/20/21 1900 10/20/21 1901 - 10/21/21 0700 10/21/21 0701 - 10/21/21 1900 10/21/21 1901 - 10/22/21 0700 10/22/21 0701 - 10/22/21 1048  Closed System Drain 1 Midline Abdomen Bulb (JP) 10 Fr.   50 17.5 10  Closed System Drain 2 Inferior LLQ Bulb (JP) 10 Fr.   60 25  15     Current examination: Flushes/aspirates easily.  Insertion site unremarkable. Suture   in place. Dressed appropriately.   Plan: Continue TID flushes with 5 cc NS. Record output Q shift. Dressing changes QD or PRN if soiled.  Call IR APP or on call IR MD if difficulty flushing or sudden change in drain output.  Repeat imaging/possible drain injection once output < 10 mL/QD (excluding flush material). Consideration for drain removal if output is < 10 mL/QD (excluding flush material), pending discussion with the providing surgical service.  Discharge planning: Please contact IR APP or on call IR MD prior to patient d/c to ensure appropriate follow up plans are in place. Typically patient will follow up with IR clinic 10-14 days post d/c for repeat imaging/possible drain injection. IR scheduler will contact patient with date/time of appointment. Patient will need to flush drain QD with 5 cc NS, record output QD, dressing changes every 2-3 days or earlier if soiled.   IR will continue to follow - please call with questions or concerns.      Electronically Signed: D. Jeananne Rama, PA-C 10/22/2021, 10:42 AM   I spent a total of 15 Minutes at the the patient's bedside AND on the patient's hospital floor or unit, greater than 50% of which was counseling/coordinating care for pelvic abscess drains    Patient ID: Augusto Garbe, female   DOB: 06-May-1960, 61 y.o.   MRN: [id number]

## 2021-10-23 ENCOUNTER — Other Ambulatory Visit (HOSPITAL_COMMUNITY): Payer: Self-pay

## 2021-10-23 DIAGNOSIS — N739 Female pelvic inflammatory disease, unspecified: Secondary | ICD-10-CM | POA: Diagnosis present

## 2021-10-23 DIAGNOSIS — K668 Other specified disorders of peritoneum: Secondary | ICD-10-CM | POA: Diagnosis not present

## 2021-10-23 LAB — CBC
HCT: 34.3 % — ABNORMAL LOW (ref 36.0–46.0)
Hemoglobin: 11.1 g/dL — ABNORMAL LOW (ref 12.0–15.0)
MCH: 30.6 pg (ref 26.0–34.0)
MCHC: 32.4 g/dL (ref 30.0–36.0)
MCV: 94.5 fL (ref 80.0–100.0)
Platelets: 424 10*3/uL — ABNORMAL HIGH (ref 150–400)
RBC: 3.63 MIL/uL — ABNORMAL LOW (ref 3.87–5.11)
RDW: 13.4 % (ref 11.5–15.5)
WBC: 12.3 10*3/uL — ABNORMAL HIGH (ref 4.0–10.5)
nRBC: 0 % (ref 0.0–0.2)

## 2021-10-23 MED ORDER — DOCUSATE SODIUM 100 MG PO CAPS: 100.0000 mg | ORAL_CAPSULE | Freq: Two times a day (BID) | ORAL | Status: DC | PRN

## 2021-10-23 MED ORDER — METRONIDAZOLE 500 MG PO TABS
500.0000 mg | ORAL_TABLET | Freq: Three times a day (TID) | ORAL | 0 refills | Status: AC
  Filled 2021-10-23: qty 15, 5d supply, fill #0

## 2021-10-23 MED ORDER — SODIUM CHLORIDE FLUSH 0.9 % IV SOLN
10.0000 mL | Freq: Every day | INTRAVENOUS | 2 refills | Status: DC
  Filled 2021-10-23: qty 300, 30d supply, fill #0

## 2021-10-23 MED ORDER — CIPROFLOXACIN HCL 500 MG PO TABS
500.0000 mg | ORAL_TABLET | Freq: Two times a day (BID) | ORAL | 0 refills | Status: AC
  Filled 2021-10-23: qty 10, 5d supply, fill #0

## 2021-10-23 NOTE — Progress Notes (Signed)
Patient was given discharge instructions, and all questions were answered. Teaching for flushing drains was done as well with teach back from patient. Patient was stable for discharge and was taken to the main exit by wheelchair.

## 2021-10-23 NOTE — Discharge Summary (Signed)
[facility] Discharge Summary   Patient ID: Nicole Wilcox MRN: [id number] DOB/AGE: 1960/05/23 61 y.o.  Admit date: 10/14/2021 Discharge date: 10/23/2021  Admitting Diagnosis: Peritoneal cavity free air [K66.8] Diverticulitis of colon with perforation [K57.20]   Discharge Diagnosis Peritoneal cavity free air [K66.8] Diverticulitis of colon with perforation [K57.20] Pelvic Abscess in female  Consultants Interventional radiology - Dr. Dwaine Gale  Imaging: No results found.  Procedures Dr. Dwaine Gale (10/21/21) - CT Guided percutaneous drain placement x2, midline pelvic abscess, left pelvic abscess   Hospital Course:  61 year old female who presented to [facility] ED with abdominal pain.  Workup showed colon perforation on CT.  Patient was transferred to [facility] ED and subsequently admitted. She improved symptomatically during admission but WBC remained elevated so repeat CT performed which showed decreased air and multiple abscesses. IR was consulted and she underwent procedure listed above.  Tolerated procedure well and was transferred to the floor.  Diet was advanced which she tolerated well.  On date of discharge the patient was voiding well, tolerating diet, ambulating well, pain resolved, vital signs stable, and felt stable for discharge home. Drain output was serosanguinous on date of discharge with culture showing ampicillin resistant E coli. She did still have mildly elevated WBC and was discharged on ciprofloxacin and flagyl to complete a 14 day course total of antibiotics. Patient will follow up in our office in 3 weeks and knows to call with questions or concerns. She will call for follow up with IR in regard to her drains.  Allergies as of 10/23/2021   No Known Allergies      Medication List     TAKE these medications    acetaminophen 650 MG CR tablet Commonly known as: TYLENOL Take 650 mg by mouth every 8 (eight) hours as needed for pain.   b complex vitamins  tablet Take 1 tablet by mouth daily.   cholecalciferol 10 MCG (400 UNIT) Tabs tablet Commonly known as: VITAMIN D3 Take 400 Units by mouth daily.   Chromium Picolinate 500 MCG Tabs Take 1 tablet by mouth daily.   ciprofloxacin 500 MG tablet Commonly known as: Cipro Take 1 tablet (500 mg total) by mouth 2 (two) times daily for 5 days.   docusate sodium 100 MG capsule Commonly known as: Colace Take 1 capsule (100 mg total) by mouth 2 (two) times daily as needed for mild constipation or moderate constipation.   DULoxetine 30 MG capsule Commonly known as: CYMBALTA TAKE 1 CAPSULE(30 MG) BY MOUTH DAILY   metroNIDAZOLE 500 MG tablet Commonly known as: Flagyl Take 1 tablet (500 mg total) by mouth 3 (three) times daily for 5 days.   multivitamin tablet Take 1 tablet by mouth daily.   Normal Saline Flush 0.9 % Soln 10 mLs by Intracatheter route daily. Flush both drains daily with 5 mL.   Potassium 99 MG Tabs Take 1 tablet by mouth daily.          Follow-up Information     Mir, Paula Libra, MD. Call in 2 week(s).   Specialties: Interventional Radiology, Diagnostic Radiology, Radiology Why: Their office should contact you for an appointment in their drain clinic for 10-14 day follow-up Contact information: [place] [address] [phone]         Rolm Bookbinder, MD. Go on 11/13/2021.   Specialty: General Surgery Why: Your appointment is 11/13/21, arrive at 1:10 pm Bring photo ID and insurance information Contact information: [place]  [address] [place] [address] [phone]                 Signed: Clarise Cruz [facility] Surgery 10/23/2021, 3:06 PM Please see Amion for pager number during day hours 7:00am-4:30pm

## 2021-10-23 NOTE — Progress Notes (Signed)
9 Days Post-Op   Subjective/Chief Complaint: Continues to feel better. Tolerating diet and good bowel function. Pain improved  Objective: Vital signs in last 24 hours: Temp:  [98.1 F (36.7 C)-98.5 F (36.9 C)] 98.1 F (36.7 C) (09/27 0434) Pulse Rate:  [67-76] 70 (09/27 0434) Resp:  [17-20] 20 (09/27 0434) BP: (156-162)/(62-94) 162/76 (09/27 0434) SpO2:  [96 %-97 %] 96 % (09/27 0434) Last BM Date : 10/23/21  Intake/Output from previous day: 09/26 0701 - 09/27 0700 In: 840 [P.O.:840] Out: 55 [Drains:55] Intake/Output this shift: No intake/output data recorded.  General nad Cv regular Pulm effort normal Ab nontender soft nondistended Drain output serosanguinous, slightly cloudy  Lab Results:  Recent Labs    10/21/21 0413 10/23/21 0443  WBC 14.8* 12.3*  HGB 10.4* 11.1*  HCT 31.2* 34.3*  PLT 352 424*    BMET Recent Labs    10/21/21 0413  NA 135  K 3.4*  CL 99  CO2 28  GLUCOSE 96  BUN 9  CREATININE 0.73  CALCIUM 8.4*    PT/INR Recent Labs    10/20/21 1047  LABPROT 15.1  INR 1.2    ABG No results for input(s): "PHART", "HCO3" in the last 72 hours.  Invalid input(s): "PCO2", "PO2"  Studies/Results: CT GUIDED PERITONEAL/RETROPERITONEAL FLUID DRAIN BY PERC CATH  Result Date: 10/21/2021 INDICATION: 61 year old woman with bowel perforation and multiple pelvic abscesses presents to IR for CT-guided drain placement EXAM: 1. CT-guided midline pelvic abscess drain placement 2. CT-guided left pelvic abscess drain placement TECHNIQUE: Multidetector CT imaging of the pelvis was performed following the standard protocol with IV contrast. RADIATION DOSE REDUCTION: This exam was performed according to the departmental dose-optimization program which includes automated exposure control, adjustment of the mA and/or kV according to patient size and/or use of iterative reconstruction technique. MEDICATIONS: The patient is currently admitted to the hospital and receiving  intravenous antibiotics. The antibiotics were administered within an appropriate time frame prior to the initiation of the procedure. ANESTHESIA/SEDATION: Moderate (conscious) sedation was employed during this procedure. A total of Versed 3 mg and Fentanyl 200 mcg was administered intravenously by the radiology nurse. Total intra-service moderate Sedation Time: 39 minutes. The patient's level of consciousness and vital signs were monitored continuously by radiology nursing throughout the procedure under my direct supervision. COMPLICATIONS: None immediate. PROCEDURE: Informed written consent was obtained from the patient after a thorough discussion of the procedural risks, benefits and alternatives. All questions were addressed. Maximal Sterile Barrier Technique was utilized including caps, mask, sterile gowns, sterile gloves, sterile drape, hand hygiene and skin antiseptic. A timeout was performed prior to the initiation of the procedure. Contrast enhanced CTA of the pelvis was performed in order to delineate the position of the left inferior epigastric artery. The anterior pelvic wall skin was prepped and draped in the usual fashion. Following local lidocaine administration, the midline abscess was accessed with a 19 gauge Yueh needle utilizing CT guidance. The catheter was exchanged for 10.2 [place] multipurpose pigtail drain over 0.035 inch guidewire. Drain secured to skin with suture and connected to bulb suction. The left pelvic abscess was then accessed with 19 gauge Yueh needle utilized CT guidance. The Yueh catheter was exchanged for 10.2 [place] multipurpose pigtail drain over 0.035 inch guidewire. Drain secured to skin with suture and connected to bulb suction. 5 mL sample from the midline pelvic abscess was sent for Gram stain and culture. IMPRESSION: 1. 10.2 French multipurpose pigtail drain placed in midline pelvic abscess. 2. 10.2 [place]  multipurpose pigtail drain placed in left pelvic abscess.  Electronically Signed   By: Miachel Roux M.D.   On: 10/21/2021 13:44   CT GUIDED PERITONEAL/RETROPERITONEAL FLUID DRAIN BY PERC CATH  Result Date: 10/21/2021 INDICATION: 61 year old woman with bowel perforation and multiple pelvic abscesses presents to IR for CT-guided drain placement EXAM: 1. CT-guided midline pelvic abscess drain placement 2. CT-guided left pelvic abscess drain placement TECHNIQUE: Multidetector CT imaging of the pelvis was performed following the standard protocol with IV contrast. RADIATION DOSE REDUCTION: This exam was performed according to the departmental dose-optimization program which includes automated exposure control, adjustment of the mA and/or kV according to patient size and/or use of iterative reconstruction technique. MEDICATIONS: The patient is currently admitted to the hospital and receiving intravenous antibiotics. The antibiotics were administered within an appropriate time frame prior to the initiation of the procedure. ANESTHESIA/SEDATION: Moderate (conscious) sedation was employed during this procedure. A total of Versed 3 mg and Fentanyl 200 mcg was administered intravenously by the radiology nurse. Total intra-service moderate Sedation Time: 39 minutes. The patient's level of consciousness and vital signs were monitored continuously by radiology nursing throughout the procedure under my direct supervision. COMPLICATIONS: None immediate. PROCEDURE: Informed written consent was obtained from the patient after a thorough discussion of the procedural risks, benefits and alternatives. All questions were addressed. Maximal Sterile Barrier Technique was utilized including caps, mask, sterile gowns, sterile gloves, sterile drape, hand hygiene and skin antiseptic. A timeout was performed prior to the initiation of the procedure. Contrast enhanced CTA of the pelvis was performed in order to delineate the position of the left inferior epigastric artery. The anterior pelvic wall  skin was prepped and draped in the usual fashion. Following local lidocaine administration, the midline abscess was accessed with a 19 gauge Yueh needle utilizing CT guidance. The catheter was exchanged for 10.2 [place] multipurpose pigtail drain over 0.035 inch guidewire. Drain secured to skin with suture and connected to bulb suction. The left pelvic abscess was then accessed with 19 gauge Yueh needle utilized CT guidance. The Yueh catheter was exchanged for 10.2 [place] multipurpose pigtail drain over 0.035 inch guidewire. Drain secured to skin with suture and connected to bulb suction. 5 mL sample from the midline pelvic abscess was sent for Gram stain and culture. IMPRESSION: 1. 10.2 French multipurpose pigtail drain placed in midline pelvic abscess. 2. 10.2 French multipurpose pigtail drain placed in left pelvic abscess. Electronically Signed   By: Miachel Roux M.D.   On: 10/21/2021 13:44    Anti-infectives: Anti-infectives (From admission, onward)    Start     Dose/Rate Route Frequency Ordered Stop   10/15/21 0000  piperacillin-tazobactam (ZOSYN) IVPB 3.375 g        3.375 g 12.5 mL/hr over 240 Minutes Intravenous Every 8 hours 10/14/21 2029     10/14/21 1815  ceFEPIme (MAXIPIME) 2 g in sodium chloride 0.9 % 100 mL IVPB       See Hyperspace for full Linked Orders Report.   2 g 200 mL/hr over 30 Minutes Intravenous  Once 10/14/21 1801 10/14/21 1953   10/14/21 1815  metroNIDAZOLE (FLAGYL) IVPB 500 mg       See Hyperspace for full Linked Orders Report.   500 mg 100 mL/hr over 60 Minutes Intravenous  Once 10/14/21 1801 10/14/21 1953       Assessment/Plan: Pneumoperitoneum likely perf diverticulitis - Suspect that she perforated left or sigmoid colon on Saturday and improved upon presentation. Currently being watched with conservative management  given reassuring exam, normal vitals, normal lactate and normal wbc on presentation. CT shows several fluid collections and decreased air with still  elevated wbc.  She would like to avoid surgery due to her husband at home and her being caregiver.  IR placed CT guided drain x2 9/25 - Cont abx - will need to be discharge on cipro/flagyl.  Cx with e coli resistant to ampicillin  Possible discharge this afternoon. She will ned to follow up with Dr. Collene Mares for colonoscopy in 6-8 weeks and then see [place] in clinic to discuss elective resection. She will need to follow up IR/drain clinic   FEN - Soft diet  VTE - SCDs, subq heparin ID - [place], [facility] Surgery 10/23/2021, 10:36 AM Please see Amion for pager number during day hours 7:00am-4:30pm

## 2021-10-23 NOTE — Discharge Instructions (Signed)
Flush drains once daily, each with 5cc normal saline.

## 2021-10-23 NOTE — TOC Progression Note (Signed)
Transition of Care [facility]) - Progression Note    Patient Details  Name: Tamani Durney MRN: [id number] Date of Birth: 13-Aug-1960  Transition of Care [facility]) CM/SW Contact  Leeroy Cha, RN Phone Number: 10/23/2021, 7:46 AM  Clinical Narrative:    [phone] GUIDED PERITONEAL/RETROPERITONEAL FLUID DRAIN BY PERC CATH following for toc needs   Expected Discharge Plan: Home/Self Care Barriers to Discharge: Continued Medical Work up  Expected Discharge Plan and Services Expected Discharge Plan: Home/Self Care   Discharge Planning Services: CM Consult   Living arrangements for the past 2 months: Single Family Home                                       Social Determinants of Health (SDOH) Interventions    Readmission Risk Interventions   No data to display

## 2021-10-23 NOTE — TOC Transition Note (Signed)
Transition of Care [facility]) - CM/SW Discharge Note   Patient Details  Name: Nicole Wilcox MRN: [id number] Date of Birth: [birth year]  Transition of Care [facility]) CM/SW Contact:  Leeroy Cha, RN Phone Number: 10/23/2021, 1:26 PM   Clinical Narrative:    [id number] discharged to return home.  Chart reviewed for TOC needs.  None found.  Patient self care.   Final next level of care: Home/Self Care Barriers to Discharge: Barriers Resolved   Patient Goals and CMS Choice Patient states their goals for this hospitalization and ongoing recovery are:: to go home CMS Medicare.gov Compare Post Acute Care list provided to:: Patient Choice offered to / list presented to : Patient  Discharge Placement                       Discharge Plan and Services   Discharge Planning Services: CM Consult                                 Social Determinants of Health (SDOH) Interventions     Readmission Risk Interventions   No data to display

## 2021-10-25 ENCOUNTER — Encounter: Payer: Self-pay | Admitting: *Deleted

## 2021-10-25 ENCOUNTER — Telehealth: Payer: Self-pay | Admitting: *Deleted

## 2021-10-25 NOTE — Patient Outreach (Addendum)
Care Coordination TOC Note Transition Care Management Follow-up Telephone Call Date of discharge and from where: Wednesday, 10/23/21- [place]; pelvic abscess with percutaneous abdominal/ pelvic drain placement How have you been since you were released from the hospital? "I am doing okay; I am a little sore and moving slow, but I am taking my time; I am managing the drains okay so far, but I admit they do make me a little nervous; I am taking my medicine the way they told me to.  My family is helping me if I need it, but I am pretty much able to do everything for myself.  I have an appointment with the surgeon scheduled already within the time frame they recommended and I will take your advice and go ahead and call the IR clinic to get something scheduled within the next 2 weeks like they recommended; they told me I did not need to see Dr. Sharlet Salina unless I just wanted to" Any questions or concerns? No  Items Reviewed: Did the pt receive and understand the discharge instructions provided? Yes  Medications obtained and verified? Yes  Other? No  Any new allergies since your discharge? No  Dietary orders reviewed? Yes Do you have support at home? Yes  daughter and other family members assisting as needed/ indicated  Home Care and Equipment/Supplies: Were home health services ordered? no If so, what is the name of the agency? N/A  Has the agency set up a time to come to the patient's home? not applicable Were any new equipment or medical supplies ordered?  No What is the name of the medical supply agency? N/A Were you able to get the supplies/equipment? not applicable Do you have any questions related to the use of the equipment or supplies? No provided reinforcement on perc drain management at home  Functional Questionnaire: (I = Independent and D = Dependent) ADLs: I  daughter and other family members assisting as needed/ indicated  Bathing/Dressing- I  Meal Prep- I  daughter and other  family members assisting as needed/ indicated  Eating- I  Maintaining continence- I  Transferring/Ambulation- I  Managing Meds- I  Follow up appointments reviewed:  PCP Hospital f/u appt confirmed? No  Scheduled to see - on - @ - confirmed no discharge recommendation to see PCP; patient understands to call PCP as needed; confirms specialty appointment is secured as per discharge instructions [facility] f/u appt confirmed? Yes  Scheduled to see surgeon on 11/13/21 @ 1:10 pm-- as per recommendation; patient understands to call IR drain clinic and states she will do as soon as our call is completed Are transportation arrangements needed? No  If their condition worsens, is the pt aware to call PCP or go to the Emergency Dept.? Yes Was the patient provided with contact information for the PCP's office or ED? Yes Was to pt encouraged to call back with questions or concerns? Yes  SDOH assessments and interventions completed:   Yes  Care Coordination Interventions Activated:  Yes   Care Coordination Interventions:  Provided education around appropriate diet in setting of diverticulitis; reinforced education provided at hospital around drain management/ flushing at home; provided education around possibility of obtaining and taking probiotics in setting of antibiotic use to prevent complications- encouraged patient to contact surgical group to obtain specific recommendations around need for probiotics; provided education around purpose of stool softener in setting of diverticulitis with perc drain at home; reminded patient to obtain flu vaccine as recommended for 2023-24 winter/ flu season  Encounter Outcome:  Pt. Visit Completed    Oneta Rack, RN, BSN, CCRN Alumnus RN CM Care Coordination/ Transition of [place] Management [phone]: direct office

## 2021-10-26 LAB — AEROBIC/ANAEROBIC CULTURE W GRAM STAIN (SURGICAL/DEEP WOUND)

## 2021-10-28 ENCOUNTER — Other Ambulatory Visit (HOSPITAL_COMMUNITY): Payer: Self-pay | Admitting: Internal Medicine

## 2021-10-28 ENCOUNTER — Other Ambulatory Visit: Payer: Self-pay | Admitting: General Surgery

## 2021-10-28 DIAGNOSIS — N739 Female pelvic inflammatory disease, unspecified: Secondary | ICD-10-CM

## 2021-11-01 ENCOUNTER — Other Ambulatory Visit (HOSPITAL_COMMUNITY): Payer: Self-pay

## 2021-11-05 ENCOUNTER — Ambulatory Visit
Admission: RE | Admit: 2021-11-05 | Discharge: 2021-11-05 | Disposition: A | Discharge status: Home / Self Care | Payer: Federal, State, Local not specified - PPO | Source: Ambulatory Visit | Attending: Internal Medicine | Admitting: Internal Medicine

## 2021-11-05 ENCOUNTER — Ambulatory Visit
Admission: RE | Admit: 2021-11-05 | Discharge: 2021-11-05 | Disposition: A | Discharge status: Home / Self Care | Payer: Federal, State, Local not specified - PPO | Source: Ambulatory Visit | Attending: General Surgery | Admitting: General Surgery

## 2021-11-05 DIAGNOSIS — N739 Female pelvic inflammatory disease, unspecified: Secondary | ICD-10-CM

## 2021-11-05 DIAGNOSIS — Z4682 Encounter for fitting and adjustment of non-vascular catheter: Secondary | ICD-10-CM | POA: Diagnosis not present

## 2021-11-05 DIAGNOSIS — K5730 Diverticulosis of large intestine without perforation or abscess without bleeding: Secondary | ICD-10-CM | POA: Diagnosis not present

## 2021-11-05 DIAGNOSIS — K651 Peritoneal abscess: Secondary | ICD-10-CM | POA: Diagnosis not present

## 2021-11-05 DIAGNOSIS — K6389 Other specified diseases of intestine: Secondary | ICD-10-CM | POA: Diagnosis not present

## 2021-11-05 MED ORDER — IOPAMIDOL (ISOVUE-300) INJECTION 61%
100.0000 mL | Freq: Once | INTRAVENOUS | Status: AC | PRN
  Administered 2021-11-05: 100 mL via INTRAVENOUS

## 2021-11-05 NOTE — Progress Notes (Signed)
Referring Physician(s): Dr. Dwain Sarna   Chief Complaint: Perforated diverticulitis with abscess  The patient is seen in follow up today s/p drain placement x 2   History of present illness:  Nicole Wilcox, 61 year old female, has a medical history significant for bradycardia, hysterectomy, cholecystectomy and hernia repair. She was admitted to [facility] hospital 10/14/21 after she presented with persistent abdominal pain and diarrhea. Imaging revealed signs concerning for perforated diverticulitis. She was evaluated by the surgery team and conservative management was attempted. A repeat CT scan 10/19/21 showed an increase in the size of several fluid and gas collections consistent with abscesses. IR was consulted for drain placement and on 10/21/21 she received two drains, one into the midline pelvic abscess and one into the left pelvic abscess.    She was discharged from the hospital 10/23/21 with oral antibiotics and outpatient follow up with Surgery. She presents today to the [facility]iology outpatient clinic for drain evaluations with CT imaging and possible drain injections. She has been flushing each drain once daily with 5 ml NS. She reports minimal output from each drain for the past 4 days. She has completed her course of antibiotics and denies any fevers, chills, nausea/vomiting or diarrhea. Her appetite and energy levels are slowly returning to normal.   Past Medical History:  Diagnosis Date   Arthritis    Bradycardia     Past Surgical History:  Procedure Laterality Date   ABDOMINAL HYSTERECTOMY     CHOLECYSTECTOMY     GALLBLADDER SURGERY     HERNIA REPAIR     HIP SURGERY     S/P 3 separate hip surgeries in 2004   INGUINAL HERNIA REPAIR     TOTAL VAGINAL HYSTERECTOMY      Allergies: Patient has no known allergies.  Medications: Prior to Admission medications   Medication Sig Start Date End Date Taking? Authorizing Provider  acetaminophen (TYLENOL) 650 MG CR tablet Take  650 mg by mouth every 8 (eight) hours as needed for pain.    [provider]  b complex vitamins tablet Take 1 tablet by mouth daily.    [provider]  cholecalciferol (VITAMIN D) 400 UNITS TABS Take 400 Units by mouth daily.    [provider]  Chromium Picolinate 500 MCG TABS Take 1 tablet by mouth daily.    [provider]  docusate sodium (COLACE) 100 MG capsule Take 1 capsule (100 mg total) by mouth 2 (two) times daily as needed for mild constipation or moderate constipation. 10/23/21   Eric Form, PA-C  DULoxetine (CYMBALTA) 30 MG capsule TAKE 1 CAPSULE(30 MG) BY MOUTH DAILY 07/15/21   Myrlene Broker, MD  Multiple Vitamin (MULTIVITAMIN) tablet Take 1 tablet by mouth daily.    [provider]  Potassium 99 MG TABS Take 1 tablet by mouth daily.    [provider]  sodium chloride flush 0.9 % SOLN injection 10 mLs by Intracatheter route daily. Flush both drains daily with 5 mL. 10/23/21   Shon Hough, NP     Family History  Problem Relation Age of Onset   Heart failure Mother    Asthma Mother    Cancer Father    Diabetes Maternal Grandmother    Hypertension Maternal Grandmother    Stroke Maternal Grandmother    Heart disease Maternal Grandfather    Cancer Paternal Grandfather    Diabetes Brother     Social History   Socioeconomic History   Marital status: Married  Spouse name: Not on file   Number of children: Not on file   Years of education: Not on file   Highest education level: Not on file  Occupational History   Occupation: retired    Fish farm manager: [employer]  Tobacco Use   Smoking status: Former    Packs/day: 1.00    Types: Cigarettes   Smokeless tobacco: Former   Tobacco comments:    quit 5 months ago  Vaping Use   Vaping Use: Never used  Substance and Sexual Activity   Alcohol use: No   Drug use: No   Sexual activity: Yes    Partners: Male  Other Topics Concern   Not on file  Social  History Narrative   Exercises alittle.   Social Determinants of Health   Financial Resource Strain: Not on file  Food Insecurity: No Food Insecurity (10/25/2021)   Hunger Vital Sign    Worried About Running Out of Food in the Last Year: Never true    Ran Out of Food in the Last Year: Never true  Transportation Needs: No Transportation Needs (10/25/2021)   PRAPARE - Hydrologist (Medical): No    Lack of Transportation (Non-Medical): No  Physical Activity: Not on file  Stress: Not on file  Social Connections: Not on file     Vital Signs: There were no vitals taken for this visit.  Physical Exam Constitutional:      General: She is not in acute distress.    Appearance: She is not ill-appearing.  Pulmonary:     Effort: Pulmonary effort is normal.  Abdominal:     Palpations: Abdomen is soft.     Tenderness: There is no abdominal tenderness.     Comments: Pelvic abscess drains x 2. Scant serous fluid in each bulb. Sutures in place. Both skin insertion sites are unremarkable  Skin:    General: Skin is warm and dry.  Neurological:     Mental Status: She is alert and oriented to person, place, and time.     Imaging: No results found.  Labs:  CBC: Recent Labs    10/18/21 0441 10/19/21 0440 10/21/21 0413 10/23/21 0443  WBC 13.0* 14.6* 14.8* 12.3*  HGB 12.5 11.6* 10.4* 11.1*  HCT 37.1 35.0* 31.2* 34.3*  PLT 218 281 352 424*    COAGS: Recent Labs    10/14/21 1933 10/20/21 1047  INR 1.5* 1.2  APTT 26  --     BMP: Recent Labs    10/17/21 0924 10/18/21 0441 10/19/21 0440 10/21/21 0413  NA 137 135 136 135  K 2.6* 3.1* 4.0 3.4*  CL 106 104 103 99  CO2 24 22 26 28   GLUCOSE 114* 115* 103* 96  BUN 9 5* 11 9  CALCIUM 8.0* 8.1* 8.5* 8.4*  CREATININE 0.66 0.61 0.76 0.73  GFRNONAA >60 >60 >60 >60    LIVER FUNCTION TESTS: Recent Labs    07/23/21 0915 10/14/21 1430  BILITOT 0.4 1.1  AST 25 23  ALT 17 17  ALKPHOS 62 54  PROT  6.8 7.1  ALBUMIN 4.2 3.7    Assessment:  Perforated diverticulitis with abscesses s/p drain placement x2 in IR 10/21/21  CT imaging shows near complete resolution of the abscesses. Each drain was injected with contrast under fluoroscopy - no fistula to the bowel identified and both drains were removed. The patient was advised to seek medical attention if she develops any redness, warmth, tenderness or purulent drainage at the  sites. She was also advised to seek medical attention if she develops fevers, chills, abdominal pain, nausea or vomiting.   She was instructed to keep the sites covered with gauze for at least 24 hours. Ok to shower after that but she knows not to submerge the sites in water for at least one week. She has a follow up with Dr. Donne Hazel on 11/13/21 and she was encouraged to keep this appointment. She knows she can call our clinic with any questions/concerns.   Signed: Theresa Duty, NP 11/05/2021, 1:06 PM   Please refer to Dr. Moises Blood attestation of this note for management and plan.

## 2021-11-13 DIAGNOSIS — K5720 Diverticulitis of large intestine with perforation and abscess without bleeding: Secondary | ICD-10-CM | POA: Diagnosis not present

## 2021-11-26 DIAGNOSIS — R933 Abnormal findings on diagnostic imaging of other parts of digestive tract: Secondary | ICD-10-CM | POA: Diagnosis not present

## 2021-11-26 DIAGNOSIS — Z1211 Encounter for screening for malignant neoplasm of colon: Secondary | ICD-10-CM | POA: Diagnosis not present

## 2021-11-26 DIAGNOSIS — K5730 Diverticulosis of large intestine without perforation or abscess without bleeding: Secondary | ICD-10-CM | POA: Diagnosis not present

## 2021-11-26 DIAGNOSIS — K5900 Constipation, unspecified: Secondary | ICD-10-CM | POA: Diagnosis not present

## 2022-01-08 DIAGNOSIS — Q438 Other specified congenital malformations of intestine: Secondary | ICD-10-CM | POA: Diagnosis not present

## 2022-01-08 DIAGNOSIS — K5730 Diverticulosis of large intestine without perforation or abscess without bleeding: Secondary | ICD-10-CM | POA: Diagnosis not present

## 2022-01-08 DIAGNOSIS — R933 Abnormal findings on diagnostic imaging of other parts of digestive tract: Secondary | ICD-10-CM | POA: Diagnosis not present

## 2022-01-08 DIAGNOSIS — Z1211 Encounter for screening for malignant neoplasm of colon: Secondary | ICD-10-CM | POA: Diagnosis not present

## 2022-01-08 LAB — HM COLONOSCOPY

## 2022-01-14 ENCOUNTER — Other Ambulatory Visit: Payer: Self-pay | Admitting: Gastroenterology

## 2022-01-14 DIAGNOSIS — Q438 Other specified congenital malformations of intestine: Secondary | ICD-10-CM

## 2022-02-03 ENCOUNTER — Encounter: Payer: Self-pay | Admitting: Internal Medicine

## 2022-02-04 MED ORDER — ALPRAZOLAM 0.25 MG PO TABS: 0.2500 mg | ORAL_TABLET | Freq: Two times a day (BID) | ORAL | 0 refills | Status: DC | PRN

## 2022-02-26 ENCOUNTER — Inpatient Hospital Stay: Admission: RE | Admit: 2022-02-26 | Payer: Federal, State, Local not specified - PPO | Source: Ambulatory Visit

## 2022-04-09 DIAGNOSIS — Z1231 Encounter for screening mammogram for malignant neoplasm of breast: Secondary | ICD-10-CM | POA: Diagnosis not present

## 2022-04-09 LAB — HM MAMMOGRAPHY

## 2022-05-15 ENCOUNTER — Ambulatory Visit
Admission: RE | Admit: 2022-05-15 | Discharge: 2022-05-15 | Disposition: A | Discharge status: Home / Self Care | Payer: Federal, State, Local not specified - PPO | Source: Ambulatory Visit | Attending: Gastroenterology | Admitting: Gastroenterology

## 2022-05-15 DIAGNOSIS — Q438 Other specified congenital malformations of intestine: Secondary | ICD-10-CM

## 2022-05-15 DIAGNOSIS — K6389 Other specified diseases of intestine: Secondary | ICD-10-CM | POA: Diagnosis not present

## 2022-05-15 DIAGNOSIS — I700 Atherosclerosis of aorta: Secondary | ICD-10-CM | POA: Diagnosis not present

## 2022-05-15 DIAGNOSIS — K5730 Diverticulosis of large intestine without perforation or abscess without bleeding: Secondary | ICD-10-CM | POA: Diagnosis not present

## 2022-06-25 ENCOUNTER — Ambulatory Visit: Payer: Federal, State, Local not specified - PPO | Admitting: Internal Medicine

## 2022-06-25 ENCOUNTER — Encounter: Payer: Self-pay | Admitting: Internal Medicine

## 2022-06-25 VITALS — BP 138/98 | HR 91 | Temp 98.3°F | Ht 66.0 in | Wt 191.0 lb

## 2022-06-25 DIAGNOSIS — F32A Depression, unspecified: Secondary | ICD-10-CM

## 2022-06-25 DIAGNOSIS — F419 Anxiety disorder, unspecified: Secondary | ICD-10-CM | POA: Diagnosis not present

## 2022-06-25 DIAGNOSIS — R5383 Other fatigue: Secondary | ICD-10-CM | POA: Diagnosis not present

## 2022-06-25 LAB — COMPREHENSIVE METABOLIC PANEL
ALT: 14 U/L (ref 0–35)
AST: 23 U/L (ref 0–37)
Albumin: 4.1 g/dL (ref 3.5–5.2)
Alkaline Phosphatase: 54 U/L (ref 39–117)
BUN: 10 mg/dL (ref 6–23)
CO2: 32 mEq/L (ref 19–32)
Calcium: 9.3 mg/dL (ref 8.4–10.5)
Chloride: 104 mEq/L (ref 96–112)
Creatinine, Ser: 0.82 mg/dL (ref 0.40–1.20)
GFR: 76.72 mL/min (ref >60.00)
Glucose, Bld: 84 mg/dL (ref 70–99)
Potassium: 4.6 mEq/L (ref 3.5–5.1)
Sodium: 141 mEq/L (ref 135–145)
Total Bilirubin: 0.5 mg/dL (ref 0.2–1.2)
Total Protein: 6.7 g/dL (ref 6.0–8.3)

## 2022-06-25 LAB — LIPID PANEL
Cholesterol: 212 mg/dL — ABNORMAL HIGH (ref 0–200)
HDL: 79 mg/dL (ref >39.00)
LDL Cholesterol: 116 mg/dL — ABNORMAL HIGH (ref 0–99)
NonHDL: 133.07
Total CHOL/HDL Ratio: 3
Triglycerides: 83 mg/dL (ref 0.0–149.0)
VLDL: 16.6 mg/dL (ref 0.0–40.0)

## 2022-06-25 LAB — CBC
HCT: 39.1 % (ref 36.0–46.0)
Hemoglobin: 13 g/dL (ref 12.0–15.0)
MCHC: 33.3 g/dL (ref 30.0–36.0)
MCV: 91.9 fl (ref 78.0–100.0)
Platelets: 206 10*3/uL (ref 150.0–400.0)
RBC: 4.25 Mil/uL (ref 3.87–5.11)
RDW: 13.8 % (ref 11.5–15.5)
WBC: 4.9 10*3/uL (ref 4.0–10.5)

## 2022-06-25 LAB — HEMOGLOBIN A1C: Hgb A1c MFr Bld: 5.2 % (ref 4.6–6.5)

## 2022-06-25 LAB — VITAMIN B12: Vitamin B-12: >1500 pg/mL — ABNORMAL HIGH (ref 211–911)

## 2022-06-25 LAB — TSH: TSH: 2.05 u[IU]/mL (ref 0.35–5.50)

## 2022-06-25 LAB — VITAMIN D 25 HYDROXY (VIT D DEFICIENCY, FRACTURES): VITD: 65.89 ng/mL (ref 30.00–100.00)

## 2022-06-25 NOTE — Progress Notes (Unsigned)
   Subjective:   Patient ID: Nicole Wilcox, female    DOB: 06/14/1960, 62 y.o.   MRN: [id number]  HPI The patient is a 62 YO female coming in for tiredness over last month or so. Sleeping but not getting rested. Recent loss of spouse and seems to be coping okay. Unsure of snoring.   Review of Systems  Constitutional:  Positive for fatigue.  HENT: Negative.    Eyes: Negative.   Respiratory:  Negative for cough, chest tightness and shortness of breath.   Cardiovascular:  Negative for chest pain, palpitations and leg swelling.  Gastrointestinal:  Negative for abdominal distention, abdominal pain, constipation, diarrhea, nausea and vomiting.  Musculoskeletal: Negative.   Skin: Negative.   Neurological: Negative.   Psychiatric/Behavioral: Negative.      Objective:  Physical Exam Constitutional:      Appearance: She is well-developed.  HENT:     Head: Normocephalic and atraumatic.  Cardiovascular:     Rate and Rhythm: Normal rate and regular rhythm.  Pulmonary:     Effort: Pulmonary effort is normal. No respiratory distress.     Breath sounds: Normal breath sounds. No wheezing or rales.  Abdominal:     General: Bowel sounds are normal. There is no distension.     Palpations: Abdomen is soft.     Tenderness: There is no abdominal tenderness. There is no rebound.  Musculoskeletal:     Cervical back: Normal range of motion.  Skin:    General: Skin is warm and dry.  Neurological:     Mental Status: She is alert and oriented to person, place, and time.     Coordination: Coordination normal.     Vitals:   06/25/22 1042 06/25/22 1045  BP: (!) 138/98 (!) 138/98  Pulse: 91   Temp: 98.3 F (36.8 C)   TempSrc: Oral   SpO2: 98%   Weight: 191 lb (86.6 kg)   Height: 5\' 6"  (1.676 m)     Assessment & Plan:  Visit time 25 minutes in face to face communication with patient and coordination of care, additional 5 minutes spent in record review, coordination or care, ordering tests,  communicating/referring to other healthcare professionals, documenting in medical records all on the same day of the visit for total time 30 minutes spent on the visit.

## 2022-06-25 NOTE — Patient Instructions (Signed)
We will check the labs today. 

## 2022-06-26 NOTE — Assessment & Plan Note (Signed)
Seems to be stable for now she is working on Pharmacologist. Still taking cymbalta 30 mg daily and using alprazolam rarely prn.

## 2022-06-26 NOTE — Assessment & Plan Note (Signed)
Checking CBC, CMP, TSH, B12, vitamin D, HgA1c. If all normal will get home sleep study.

## 2022-06-30 ENCOUNTER — Other Ambulatory Visit: Payer: Self-pay | Admitting: Internal Medicine

## 2022-06-30 DIAGNOSIS — R0683 Snoring: Secondary | ICD-10-CM

## 2022-08-13 ENCOUNTER — Ambulatory Visit (HOSPITAL_BASED_OUTPATIENT_CLINIC_OR_DEPARTMENT_OTHER): Payer: Federal, State, Local not specified - PPO | Attending: Internal Medicine | Admitting: Internal Medicine

## 2022-08-13 VITALS — Ht 66.0 in | Wt 191.0 lb

## 2022-08-13 DIAGNOSIS — G4733 Obstructive sleep apnea (adult) (pediatric): Secondary | ICD-10-CM | POA: Diagnosis not present

## 2022-08-13 DIAGNOSIS — R0683 Snoring: Secondary | ICD-10-CM | POA: Diagnosis not present

## 2022-08-14 ENCOUNTER — Other Ambulatory Visit: Payer: Self-pay | Admitting: Internal Medicine

## 2022-08-28 ENCOUNTER — Encounter: Payer: Self-pay | Admitting: Internal Medicine

## 2022-08-30 DIAGNOSIS — R0683 Snoring: Secondary | ICD-10-CM

## 2022-08-30 NOTE — Procedures (Signed)
   Patient Name: Nicole Wilcox, Nicole Wilcox Date: 08/13/2022 Gender: Female D.O.B: 09-03-1960 Age (years): 62 Referring Provider: Hillard Danker Height (inches): 66 Interpreting Physician: Jetty Duhamel MD, ABSM Weight (lbs): 191 RPSGT:  Sink BMI: 31 MRN: [id number] Neck Size: <br>  CLINICAL INFORMATION Sleep Study Type: HST Indication for sleep study: sleep disorder Epworth Sleepiness Score: 9  SLEEP STUDY TECHNIQUE A multi-channel overnight portable sleep study was performed. The channels recorded were: nasal airflow, thoracic respiratory movement, and oxygen saturation with a pulse oximetry. Snoring was also monitored.  MEDICATIONS Patient self administered medications include: none reported.  SLEEP ARCHITECTURE Patient was studied for 348.4 minutes. The sleep efficiency was 100.0 % and the patient was supine for 0%. The arousal index was 0.0 per hour.  RESPIRATORY PARAMETERS The overall AHI was 21.9 per hour, with a central apnea index of 0 per hour. The oxygen nadir was 88% during sleep.  CARDIAC DATA Mean heart rate during sleep was 62.8 bpm.  IMPRESSIONS - Moderate obstructive sleep apnea occurred during this study (AHI = 21.9/h). - Mild oxygen desaturation was noted during this study (Min O2 = 88%, Mean 96%). - Patient snored.  DIAGNOSIS - Obstructive Sleep Apnea (G47.33)  RECOMMENDATIONS - Suggest CPAP titration sleep study or autopap. Other options would be based on clinical judgment. - Be careful with alcohol, sedatives and other CNS depressants that may worsen sleep apnea and disrupt normal sleep architecture. - Sleep hygiene should be reviewed to assess factors that may improve sleep quality. - Weight management and regular exercise should be initiated or continued.  [Electronically signed] 08/30/2022 11:58 AM  Jetty Duhamel MD, ABSM Diplomate, American Board of Sleep Medicine NPI: [id number]                           Jetty Duhamel Diplomate, American Board of Sleep Medicine  ELECTRONICALLY SIGNED ON:  08/30/2022, 11:55 AM [facility] PH: [phone]   FX: [phone] ACCREDITED BY THE AMERICAN ACADEMY OF SLEEP MEDICINE

## 2022-09-01 ENCOUNTER — Telehealth: Payer: Self-pay | Admitting: Internal Medicine

## 2022-09-01 NOTE — Telephone Encounter (Signed)
Please call patient an inform sleep study showed moderate sleep apnea and CPAP is recommended. Would she like [place] to order?

## 2022-09-01 NOTE — Telephone Encounter (Signed)
Pt states that she has a CPAP machine in her home which is her husbands and stated that she can use that

## 2022-09-01 NOTE — Telephone Encounter (Signed)
Ok does she need supplies we can order

## 2022-09-03 NOTE — Telephone Encounter (Signed)
Pt states that she will have to check but as of right now she does not but will let us know if she does she also has an upcoming appt with MD next and will inform if she does

## 2022-09-08 ENCOUNTER — Encounter: Payer: Self-pay | Admitting: Internal Medicine

## 2022-09-08 ENCOUNTER — Ambulatory Visit (INDEPENDENT_AMBULATORY_CARE_PROVIDER_SITE_OTHER): Payer: Federal, State, Local not specified - PPO | Admitting: Internal Medicine

## 2022-09-08 VITALS — BP 130/82 | HR 55 | Temp 98.2°F | Ht 66.0 in | Wt 188.4 lb

## 2022-09-08 DIAGNOSIS — Z0000 Encounter for general adult medical examination without abnormal findings: Secondary | ICD-10-CM

## 2022-09-08 DIAGNOSIS — Z23 Encounter for immunization: Secondary | ICD-10-CM

## 2022-09-08 DIAGNOSIS — F32A Depression, unspecified: Secondary | ICD-10-CM | POA: Diagnosis not present

## 2022-09-08 DIAGNOSIS — G4733 Obstructive sleep apnea (adult) (pediatric): Secondary | ICD-10-CM | POA: Diagnosis not present

## 2022-09-08 DIAGNOSIS — F419 Anxiety disorder, unspecified: Secondary | ICD-10-CM | POA: Diagnosis not present

## 2022-09-08 NOTE — Assessment & Plan Note (Signed)
Moderate on recent sleep study and she is using CPAP now.

## 2022-09-08 NOTE — Assessment & Plan Note (Signed)
Flu shot yearly. Shingrix complete. Tetanus done today. Colonoscopy due 2024 later. Mammogram up to date, pap smear up to date and dexa up to date. Counseled about sun safety and mole surveillance. Counseled about the dangers of distracted driving. Given 10 year screening recommendations.

## 2022-09-08 NOTE — Assessment & Plan Note (Signed)
Overall well controlled with cymbalta 30 mg daily and using alprazolam 0.25 mg BID prn rare. Continue.

## 2022-09-08 NOTE — Progress Notes (Signed)
   Subjective:   Patient ID: Nicole Wilcox, female    DOB: December 29, 1960, 62 y.o.   MRN: [id number]  HPI The patient is here for physical.  PMH, [facility], social history reviewed and updated  Review of Systems  Constitutional: Negative.   HENT: Negative.    Eyes: Negative.   Respiratory:  Negative for cough, chest tightness and shortness of breath.   Cardiovascular:  Negative for chest pain, palpitations and leg swelling.  Gastrointestinal:  Negative for abdominal distention, abdominal pain, constipation, diarrhea, nausea and vomiting.  Musculoskeletal:  Positive for arthralgias.  Skin: Negative.   Neurological: Negative.   Psychiatric/Behavioral: Negative.      Objective:  Physical Exam Constitutional:      Appearance: She is well-developed.  HENT:     Head: Normocephalic and atraumatic.  Cardiovascular:     Rate and Rhythm: Normal rate and regular rhythm.  Pulmonary:     Effort: Pulmonary effort is normal. No respiratory distress.     Breath sounds: Normal breath sounds. No wheezing or rales.  Abdominal:     General: Bowel sounds are normal. There is no distension.     Palpations: Abdomen is soft.     Tenderness: There is no abdominal tenderness. There is no rebound.  Musculoskeletal:        General: Tenderness present.     Cervical back: Normal range of motion.  Skin:    General: Skin is warm and dry.  Neurological:     Mental Status: She is alert and oriented to person, place, and time.     Coordination: Coordination normal.     Vitals:   09/08/22 0935 09/08/22 1010  BP: (!) 140/86 130/82  Pulse: (!) 55   Temp: 98.2 F (36.8 C)   TempSrc: Oral   SpO2: 94%   Weight: 188 lb 6 oz (85.4 kg)   Height: 5\' 6"  (1.676 m)     Assessment & Plan:  Tdap given at visit

## 2022-09-10 ENCOUNTER — Other Ambulatory Visit (HOSPITAL_COMMUNITY): Payer: Self-pay

## 2023-03-21 ENCOUNTER — Encounter: Payer: Self-pay | Admitting: Internal Medicine

## 2023-03-24 ENCOUNTER — Ambulatory Visit: Payer: Federal, State, Local not specified - PPO | Admitting: Internal Medicine

## 2023-03-24 ENCOUNTER — Encounter: Payer: Self-pay | Admitting: Internal Medicine

## 2023-03-24 VITALS — BP 138/86 | HR 48 | Temp 98.4°F | Ht 66.0 in | Wt 190.0 lb

## 2023-03-24 DIAGNOSIS — K5792 Diverticulitis of intestine, part unspecified, without perforation or abscess without bleeding: Secondary | ICD-10-CM | POA: Insufficient documentation

## 2023-03-24 MED ORDER — AMOXICILLIN-POT CLAVULANATE 875-125 MG PO TABS: 1.0000 | ORAL_TABLET | Freq: Two times a day (BID) | ORAL | 0 refills | Status: AC

## 2023-03-24 MED ORDER — ALPRAZOLAM 0.25 MG PO TABS: 0.2500 mg | ORAL_TABLET | Freq: Two times a day (BID) | ORAL | 2 refills | Status: AC | PRN

## 2023-03-24 NOTE — Progress Notes (Signed)
   Subjective:   Patient ID: Nicole Wilcox, female    DOB: [birth year], 63 y.o.   MRN: [id number]  HPI The patient is a 63 YO female coming in for stomach issues including pain and diarrhea (loose stools) since Friday. She denies fevers or chills. She denies blood in stool (trace amount with wiping one time). She did eat everything bagel Thursday which she believes triggered this. She had prior severe diverticulitis with rupture and drain previously. Feel similar but early. She has stopped diet for 1-2 days and then added back liquids and now soft food today without worsening.   Review of Systems  Constitutional: Negative.   HENT: Negative.    Eyes: Negative.   Respiratory:  Negative for cough, chest tightness and shortness of breath.   Cardiovascular:  Negative for chest pain, palpitations and leg swelling.  Gastrointestinal:  Positive for abdominal pain, diarrhea and nausea. Negative for abdominal distention, constipation and vomiting.  Musculoskeletal: Negative.   Skin: Negative.   Neurological: Negative.   Psychiatric/Behavioral: Negative.      Objective:  Physical Exam Constitutional:      Appearance: She is well-developed.  HENT:     Head: Normocephalic and atraumatic.  Cardiovascular:     Rate and Rhythm: Normal rate and regular rhythm.  Pulmonary:     Effort: Pulmonary effort is normal. No respiratory distress.     Breath sounds: Normal breath sounds. No wheezing or rales.  Abdominal:     General: Bowel sounds are normal. There is no distension.     Palpations: Abdomen is soft.     Tenderness: There is abdominal tenderness. There is no rebound.  Musculoskeletal:     Cervical back: Normal range of motion.  Skin:    General: Skin is warm and dry.  Neurological:     Mental Status: She is alert and oriented to person, place, and time.     Coordination: Coordination normal.     Vitals:   03/24/23 1034  BP: 138/86  Pulse: (!) 48  Temp: 98.4 F (36.9 C)  TempSrc:  Oral  SpO2: 95%  Weight: 190 lb (86.2 kg)  Height: 5\' 6"  (1.676 m)    Assessment & Plan:

## 2023-03-24 NOTE — Patient Instructions (Signed)
 We have sent in augmentin to take 1 pill twice a day for 1 week.  Okay to increase diet slowly.

## 2023-03-24 NOTE — Assessment & Plan Note (Signed)
 With known diverticulosis and feels same to prior episode. Rx augmentin 1 week. She will continue with soft and bland foods for a few more days then increase diet gradually as able.

## 2023-04-15 DIAGNOSIS — Z1231 Encounter for screening mammogram for malignant neoplasm of breast: Secondary | ICD-10-CM | POA: Diagnosis not present

## 2023-04-15 LAB — HM MAMMOGRAPHY

## 2023-04-16 ENCOUNTER — Encounter: Payer: Self-pay | Admitting: Internal Medicine

## 2023-07-26 ENCOUNTER — Encounter: Payer: Self-pay | Admitting: Internal Medicine

## 2023-07-27 NOTE — Telephone Encounter (Signed)
**Note De-identified  [place] Obfuscation** Please advise 

## 2023-08-09 ENCOUNTER — Other Ambulatory Visit: Payer: Self-pay | Admitting: Internal Medicine

## 2023-09-11 ENCOUNTER — Encounter: Payer: Self-pay | Admitting: Internal Medicine

## 2023-09-11 ENCOUNTER — Ambulatory Visit (INDEPENDENT_AMBULATORY_CARE_PROVIDER_SITE_OTHER): Admitting: Internal Medicine

## 2023-09-11 VITALS — BP 120/84 | HR 74 | Temp 98.3°F | Ht 66.0 in | Wt 186.0 lb

## 2023-09-11 DIAGNOSIS — Z23 Encounter for immunization: Secondary | ICD-10-CM

## 2023-09-11 DIAGNOSIS — Z13220 Encounter for screening for lipoid disorders: Secondary | ICD-10-CM | POA: Diagnosis not present

## 2023-09-11 DIAGNOSIS — F32A Depression, unspecified: Secondary | ICD-10-CM

## 2023-09-11 DIAGNOSIS — R5383 Other fatigue: Secondary | ICD-10-CM

## 2023-09-11 DIAGNOSIS — K5792 Diverticulitis of intestine, part unspecified, without perforation or abscess without bleeding: Secondary | ICD-10-CM

## 2023-09-11 DIAGNOSIS — Z0000 Encounter for general adult medical examination without abnormal findings: Secondary | ICD-10-CM | POA: Diagnosis not present

## 2023-09-11 DIAGNOSIS — G4733 Obstructive sleep apnea (adult) (pediatric): Secondary | ICD-10-CM

## 2023-09-11 DIAGNOSIS — F419 Anxiety disorder, unspecified: Secondary | ICD-10-CM

## 2023-09-11 LAB — LIPID PANEL
Cholesterol: 215 mg/dL — ABNORMAL HIGH (ref 0–200)
HDL: 77.2 mg/dL (ref >39.00)
LDL Cholesterol: 120 mg/dL — ABNORMAL HIGH (ref 0–99)
NonHDL: 137.58
Total CHOL/HDL Ratio: 3
Triglycerides: 88 mg/dL (ref 0.0–149.0)
VLDL: 17.6 mg/dL (ref 0.0–40.0)

## 2023-09-11 LAB — CBC
HCT: 39 % (ref 36.0–46.0)
Hemoglobin: 12.9 g/dL (ref 12.0–15.0)
MCHC: 33.1 g/dL (ref 30.0–36.0)
MCV: 92.2 fl (ref 78.0–100.0)
Platelets: 202 K/uL (ref 150.0–400.0)
RBC: 4.23 Mil/uL (ref 3.87–5.11)
RDW: 13.6 % (ref 11.5–15.5)
WBC: 4.8 K/uL (ref 4.0–10.5)

## 2023-09-11 LAB — COMPREHENSIVE METABOLIC PANEL WITH GFR
ALT: 17 U/L (ref 0–35)
AST: 27 U/L (ref 0–37)
Albumin: 4.1 g/dL (ref 3.5–5.2)
Alkaline Phosphatase: 61 U/L (ref 39–117)
BUN: 18 mg/dL (ref 6–23)
CO2: 31 meq/L (ref 19–32)
Calcium: 9.1 mg/dL (ref 8.4–10.5)
Chloride: 103 meq/L (ref 96–112)
Creatinine, Ser: 0.77 mg/dL (ref 0.40–1.20)
GFR: 82.04 mL/min (ref >60.00)
Glucose, Bld: 82 mg/dL (ref 70–99)
Potassium: 4.4 meq/L (ref 3.5–5.1)
Sodium: 141 meq/L (ref 135–145)
Total Bilirubin: 0.8 mg/dL (ref 0.2–1.2)
Total Protein: 6.6 g/dL (ref 6.0–8.3)

## 2023-09-11 LAB — VITAMIN D 25 HYDROXY (VIT D DEFICIENCY, FRACTURES): VITD: 46.8 ng/mL (ref 30.00–100.00)

## 2023-09-11 LAB — TSH: TSH: 2.1 u[IU]/mL (ref 0.35–5.50)

## 2023-09-11 LAB — VITAMIN B12: Vitamin B-12: >1500 pg/mL — ABNORMAL HIGH (ref 211–911)

## 2023-09-11 LAB — HEMOGLOBIN A1C: Hgb A1c MFr Bld: 5.6 % (ref 4.6–6.5)

## 2023-09-11 MED ORDER — SERTRALINE HCL 50 MG PO TABS: 50.0000 mg | ORAL_TABLET | Freq: Every day | ORAL | 3 refills | Status: AC

## 2023-09-11 MED ORDER — AMOXICILLIN-POT CLAVULANATE 875-125 MG PO TABS: 1.0000 | ORAL_TABLET | Freq: Two times a day (BID) | ORAL | 0 refills | Status: AC

## 2023-09-11 NOTE — Assessment & Plan Note (Signed)
 Cymbalta  30 mg daily not helping well and tried to dose increase with side effects which limited dosing. Rx zoloft  50 mg daily to add to cymbalta  30 mg daily to help. She is using alprazolam  0.25 mg BID prn due to loss of spouse.

## 2023-09-11 NOTE — Assessment & Plan Note (Addendum)
 Flu shot yearly. Pneumonia given 20. Shingrix  complete. Tetanus up to date. Colonoscopy due. Mammogram up to date, pap smear up to date. Counseled about sun safety and mole surveillance. Counseled about the dangers of distracted driving. Given 10 year screening recommendations.

## 2023-09-11 NOTE — Assessment & Plan Note (Signed)
 Several episodes in the last year. Rx augmentin  for early antibiotics triggered by her symptoms. Reminded about need for follow up with GI and she is not ready to pursue this yet.

## 2023-09-11 NOTE — Progress Notes (Signed)
   Subjective:   Patient ID: Nicole Wilcox, female    DOB: December 07, 1960, 63 y.o.   MRN: [id number]  The patient is here for physical. Pertinent topics discussed: Discussed the use of AI scribe software for clinical note transcription with the patient, who gave verbal consent to proceed.  History of Present Illness Nicole Wilcox is a 63 year old female who presents with anxiety and heart palpitations.  She experiences anxiety characterized by feeling edgy and jumpy, accompanied by heart palpitations. Despite increasing her medication dosage to two pills, her symptoms persist. She describes feeling panicky, with her heart feeling 'funny', which leads her to go to bed early to cope. Sleep provides temporary relief, but symptoms gradually return.  She is responsible for caring for her one-year-old and two-and-a-half-year-old grandchildren, which she finds both joyful and overwhelming. Extended periods of caregiving due to their parents' work schedules contribute to her stress and fatigue. By the third day of caregiving, she feels particularly exhausted and in need of rest.  She has a history of diverticulitis flares, which she associates with certain foods, such as cauliflower. A notable flare-up occurred while attending a wedding in [place] , causing panic due to the timing and her commitments.  Her skin has become very thin, making it prone to injury from minor contact, such as her dog rubbing against her.  No new breathing troubles outside of feeling panicky. She remains active while caring for her grandchildren. No new stomach issues but mentions diverticulitis flares. No new skin spots, moles, or lumps.  PMH, [facility], social history reviewed and updated  Review of Systems  Constitutional: Negative.   HENT: Negative.    Eyes: Negative.   Respiratory:  Negative for cough, chest tightness and shortness of breath.   Cardiovascular:  Negative for chest pain, palpitations and leg swelling.   Gastrointestinal:  Positive for abdominal pain. Negative for abdominal distention, constipation, diarrhea, nausea and vomiting.  Musculoskeletal: Negative.   Skin: Negative.   Neurological: Negative.   Psychiatric/Behavioral:  Positive for dysphoric mood. The patient is nervous/anxious.     Objective:  Physical Exam Constitutional:      Appearance: She is well-developed.  HENT:     Head: Normocephalic and atraumatic.  Cardiovascular:     Rate and Rhythm: Normal rate and regular rhythm.  Pulmonary:     Effort: Pulmonary effort is normal. No respiratory distress.     Breath sounds: Normal breath sounds. No wheezing or rales.  Abdominal:     General: Bowel sounds are normal. There is no distension.     Palpations: Abdomen is soft.     Tenderness: There is no abdominal tenderness. There is no rebound.  Musculoskeletal:     Cervical back: Normal range of motion.  Skin:    General: Skin is warm and dry.  Neurological:     Mental Status: She is alert and oriented to person, place, and time.     Coordination: Coordination normal.    Vitals:   09/11/23 0849  BP: 120/84  Pulse: 74  Temp: 98.3 F (36.8 C)  TempSrc: Oral  SpO2: 97%  Weight: 186 lb (84.4 kg)  Height: 5' 6 (1.676 m)    Assessment & Plan:  Prevnar 20 given at visit

## 2023-09-11 NOTE — Assessment & Plan Note (Signed)
 Checking TSH, vitamin D  and B12 and HgA1c to assess.

## 2023-09-11 NOTE — Assessment & Plan Note (Signed)
 Still using CPAP daily and getting benefit from same.

## 2023-09-11 NOTE — Patient Instructions (Addendum)
 We have sent in zoloft  (sertraline ) to take daily to help with mood.  We have sent in augmentin  to take 1 pill twice a day for 1 week if you are having a flare.

## 2023-09-14 ENCOUNTER — Ambulatory Visit: Payer: Self-pay | Admitting: Internal Medicine

## 2023-09-29 ENCOUNTER — Ambulatory Visit: Admitting: Internal Medicine

## 2023-09-29 ENCOUNTER — Encounter: Payer: Self-pay | Admitting: Internal Medicine

## 2023-09-29 VITALS — BP 120/82 | HR 73 | Temp 98.1°F | Ht 66.0 in | Wt 187.0 lb

## 2023-09-29 DIAGNOSIS — R0602 Shortness of breath: Secondary | ICD-10-CM

## 2023-09-29 MED ORDER — ALBUTEROL SULFATE HFA 108 (90 BASE) MCG/ACT IN AERS: 2.0000 | INHALATION_SPRAY | Freq: Four times a day (QID) | RESPIRATORY_TRACT | 2 refills | Status: AC | PRN

## 2023-09-29 MED ORDER — METHYLPREDNISOLONE ACETATE 40 MG/ML IJ SUSP
40.0000 mg | Freq: Once | INTRAMUSCULAR | Status: AC
Start: 2023-09-29 — End: 2023-09-29
  Administered 2023-09-29: 40 mg via INTRAMUSCULAR

## 2023-09-29 MED ORDER — AZITHROMYCIN 250 MG PO TABS: ORAL_TABLET | ORAL | 0 refills | Status: AC

## 2023-09-29 NOTE — Progress Notes (Signed)
   Subjective:   Patient ID: Nicole Wilcox, female    DOB: [birth year], 63 y.o.   MRN: [id number]  Discussed the use of AI scribe software for clinical note transcription with the patient, who gave verbal consent to proceed.  History of Present Illness Nicole Wilcox is a 63 year old female who presents with worsening breathing difficulties and congestion.  She has been experiencing worsening breathing difficulties, describing it as 'really hard' and 'pretty horrible'. There is significant fatigue, with even simple tasks like going to the bathroom requiring effort, and she feels the need to return to sleep frequently. She also notes a wheezing sound when she breathes.  She is currently on her third day of Augmentin , which was started due to her symptoms. Initially, there was significant nasal drainage, with continuous nasal discharge for the first couple of days. This morning, she noticed an improvement in nasal symptoms, as her nose was not running, and her ears were not clogged. Despite this, breathing difficulties persist.  No fever, chills, or sore throat. Her blood pressure, pulse, and oxygen levels have remained normal. There is no current ear pain, and her nose is described as a little dried up.  Review of Systems  Constitutional:  Positive for activity change and appetite change. Negative for chills, fatigue, fever and unexpected weight change.  HENT:  Positive for congestion, postnasal drip, rhinorrhea and sinus pressure. Negative for ear discharge, ear pain, sinus pain, sneezing, sore throat, tinnitus, trouble swallowing and voice change.   Eyes: Negative.   Respiratory:  Positive for cough. Negative for chest tightness, shortness of breath and wheezing.   Cardiovascular: Negative.   Gastrointestinal: Negative.   Musculoskeletal:  Positive for myalgias.  Neurological: Negative.     Objective:  Physical Exam Constitutional:      Appearance: She is well-developed.  HENT:      Head: Normocephalic and atraumatic.     Comments: Oropharynx with redness and clear drainage, nose with swollen turbinates, TMs normal bilaterally.  Neck:     Thyroid : No thyromegaly.  Cardiovascular:     Rate and Rhythm: Normal rate and regular rhythm.  Pulmonary:     Effort: Pulmonary effort is normal. No respiratory distress.     Breath sounds: Wheezing and rhonchi present. No rales.  Abdominal:     Palpations: Abdomen is soft.  Musculoskeletal:        General: Tenderness present.     Cervical back: Normal range of motion.  Lymphadenopathy:     Cervical: No cervical adenopathy.  Skin:    General: Skin is warm and dry.  Neurological:     Mental Status: She is alert and oriented to person, place, and time.     Vitals:   09/29/23 1108  BP: 120/82  Pulse: 73  Temp: 98.1 F (36.7 C)  TempSrc: Oral  SpO2: 98%  Weight: 187 lb (84.8 kg)  Height: 5' 6 (1.676 m)   Depo-medrol  40 mg IM given at visit  Assessment and Plan Assessment & Plan Shortness of breath   Worsening shortness of breath likely due to community-acquired pneumonia. Symptoms persist despite Augmentin , and the clinical picture is consistent with pneumonia. Administer a steroid shot to reduce wheezing and improve airflow. Prescribe an albuterol  inhaler for immediate relief of bronchospasm. Prescribe a Z-Pak (azithromycin ) to cover additional bacterial pathogens.

## 2023-09-29 NOTE — Assessment & Plan Note (Signed)
 Worsening shortness of breath likely due to community-acquired pneumonia. Symptoms persist despite Augmentin , and the clinical picture is consistent with pneumonia. Administer a steroid shot to reduce wheezing and improve airflow. Prescribe an albuterol  inhaler for immediate relief of bronchospasm. Prescribe a Z-Pak (azithromycin ) to cover additional bacterial pathogens.

## 2023-09-29 NOTE — Patient Instructions (Signed)
 We have given you a steroid shot today.  We have sent in albuterol  to use 2 puffs as needed for breathing problems up to every 4 hours.   We have sent in the z-pack (azithromycin ) to take 2 pills today, then 1 pill a day on days 2-5.

## 2024-09-12 ENCOUNTER — Encounter: Admitting: Internal Medicine
